# Patient Record
Sex: Male | Born: 1965 | Race: White | Hispanic: No | Marital: Married | Smoking: Never smoker
Health system: Southern US, Community
[De-identification: ages and names within clinical notes are randomized; demographics above are authoritative.]

## PROBLEM LIST (undated history)

## (undated) ENCOUNTER — Ambulatory Visit: Admission: EM | Source: Ambulatory Visit

## (undated) DIAGNOSIS — I10 Essential (primary) hypertension: Secondary | ICD-10-CM

## (undated) DIAGNOSIS — J45909 Unspecified asthma, uncomplicated: Secondary | ICD-10-CM

## (undated) DIAGNOSIS — K219 Gastro-esophageal reflux disease without esophagitis: Secondary | ICD-10-CM

## (undated) HISTORY — PX: TIBIA FRACTURE SURGERY: SHX806

---

## 2017-06-20 ENCOUNTER — Encounter (HOSPITAL_COMMUNITY): Payer: Self-pay | Admitting: *Deleted

## 2017-06-20 DIAGNOSIS — F17290 Nicotine dependence, other tobacco product, uncomplicated: Secondary | ICD-10-CM | POA: Insufficient documentation

## 2017-06-20 DIAGNOSIS — N390 Urinary tract infection, site not specified: Secondary | ICD-10-CM | POA: Diagnosis not present

## 2017-06-20 DIAGNOSIS — R300 Dysuria: Secondary | ICD-10-CM | POA: Insufficient documentation

## 2017-06-20 DIAGNOSIS — R1030 Lower abdominal pain, unspecified: Secondary | ICD-10-CM | POA: Diagnosis not present

## 2017-06-20 LAB — URINALYSIS, ROUTINE W REFLEX MICROSCOPIC
Bilirubin Urine: NEGATIVE
GLUCOSE, UA: NEGATIVE mg/dL
Ketones, ur: NEGATIVE mg/dL
NITRITE: NEGATIVE
PROTEIN: 100 mg/dL — AB
Specific Gravity, Urine: 1.017 (ref 1.005–1.030)
pH: 5 (ref 5.0–8.0)

## 2017-06-20 NOTE — ED Triage Notes (Signed)
[  pt c/o pain and burning with urination, lower abd pain that started yesterday,

## 2017-06-21 ENCOUNTER — Emergency Department (HOSPITAL_COMMUNITY)
Admission: EM | Admit: 2017-06-21 | Discharge: 2017-06-21 | Disposition: A | Discharge status: Home / Self Care | Payer: BLUE CROSS/BLUE SHIELD | Attending: Emergency Medicine | Admitting: Emergency Medicine

## 2017-06-21 DIAGNOSIS — N390 Urinary tract infection, site not specified: Secondary | ICD-10-CM

## 2017-06-21 LAB — CBC
HEMATOCRIT: 42 % (ref 39.0–52.0)
HEMOGLOBIN: 14.6 g/dL (ref 13.0–17.0)
MCH: 31.5 pg (ref 26.0–34.0)
MCHC: 34.8 g/dL (ref 30.0–36.0)
MCV: 90.7 fL (ref 78.0–100.0)
Platelets: 278 10*3/uL (ref 150–400)
RBC: 4.63 MIL/uL (ref 4.22–5.81)
RDW: 12.5 % (ref 11.5–15.5)
WBC: 12.1 10*3/uL — ABNORMAL HIGH (ref 4.0–10.5)

## 2017-06-21 LAB — BASIC METABOLIC PANEL
Anion gap: 7 (ref 5–15)
BUN: 8 mg/dL (ref 6–20)
CHLORIDE: 103 mmol/L (ref 101–111)
CO2: 27 mmol/L (ref 22–32)
Calcium: 8.9 mg/dL (ref 8.9–10.3)
Creatinine, Ser: 0.71 mg/dL (ref 0.61–1.24)
GFR calc Af Amer: >60 mL/min (ref >60)
GFR calc non Af Amer: >60 mL/min (ref >60)
Glucose, Bld: 116 mg/dL — ABNORMAL HIGH (ref 65–99)
POTASSIUM: 4.2 mmol/L (ref 3.5–5.1)
SODIUM: 137 mmol/L (ref 135–145)

## 2017-06-21 MED ORDER — TAMSULOSIN HCL 0.4 MG PO CAPS: 0.4000 mg | ORAL_CAPSULE | Freq: Every day | ORAL | 0 refills | Status: DC

## 2017-06-21 MED ORDER — CEPHALEXIN 500 MG PO CAPS: 500.0000 mg | ORAL_CAPSULE | Freq: Four times a day (QID) | ORAL | 0 refills | Status: DC

## 2017-06-21 NOTE — ED Provider Notes (Signed)
[facility] EMERGENCY DEPARTMENT Provider Note   CSN: [id number] Arrival date & time: 06/20/17  2119     History   Chief Complaint Chief Complaint  Patient presents with  . Abdominal Pain    HPI Ryan Berry is a 51 y.o. male.  The history is provided by the patient.  Abdominal Pain   This is a new problem. The current episode started 12 to 24 hours ago. The problem has not changed since onset.The pain is located in the suprapubic region. The pain is moderate. Associated symptoms include dysuria, frequency and hematuria. Pertinent negatives include fever, nausea and vomiting. The symptoms are aggravated by urination. Relieved by: rest.  pt reports for past 24 hrs he has had lower abdominal pain with dysuria and difficulty urinating He reports he is only able to pass small amounts of urine He reports hematuria He does report recent h/o urinary frequency and nocturia   PMH - none Home Medications    Prior to Admission medications   Not on File    Family History No family history on file.  Social History Social History  Substance Use Topics  . Smoking status: Never Smoker  . Smokeless tobacco: Current User  . Alcohol use No     Allergies   Penicillins   Review of Systems Review of Systems  Constitutional: Negative for fever.  Respiratory: Negative for cough.   Gastrointestinal: Positive for abdominal pain. Negative for nausea and vomiting.  Genitourinary: Positive for decreased urine volume, difficulty urinating, dysuria, frequency, hematuria and urgency. Negative for discharge and testicular pain.  All other systems reviewed and are negative.    Physical Exam Updated Vital Signs BP (!) 138/95   Pulse 87   Temp 99.5 F (37.5 C) (Oral)   Resp 17   Ht 1.803 m ( )   Wt 133.8 kg (295 lb)   SpO2 97%   BMI 41.14 kg/m   Physical Exam CONSTITUTIONAL: Well developed/well nourished HEAD: Normocephalic/atraumatic ENMT: Mucous membranes  moist NECK: supple no meningeal signs SPINE/BACK:entire spine nontender CV: S1/S2 noted, no murmurs/rubs/gallops noted LUNGS: Lungs are clear to auscultation bilaterally, no apparent distress ABDOMEN: soft, nontender, no rebound or guarding, bowel sounds noted throughout abdomen GU:no cva tenderness, no scrotal tenderness, no penile discharge, male nurse tech present for exam Rectal - no blood or melena noted.  No rectal mass.  Prostate is enlarged but nontender, male tech Marylene Land present for exam NEURO: Pt is awake/alert/appropriate, moves all extremitiesx4.  No facial droop.   EXTREMITIES: pulses normal/equal, full ROM SKIN: warm, color normal PSYCH: no abnormalities of mood noted, alert and oriented to situation   ED Treatments / Results  Labs (all labs ordered are listed, but only abnormal results are displayed) Labs Reviewed  URINALYSIS, ROUTINE W REFLEX MICROSCOPIC - Abnormal; Notable for the following:       Result Value   APPearance CLOUDY (*)    Hgb urine dipstick LARGE (*)    Protein, ur 100 (*)    Leukocytes, UA SMALL (*)    Bacteria, UA FEW (*)    Squamous Epithelial / LPF 0-5 (*)    Non Squamous Epithelial 0-5 (*)    All other components within normal limits  CBC - Abnormal; Notable for the following:    WBC 12.1 (*)    All other components within normal limits  BASIC METABOLIC PANEL - Abnormal; Notable for the following:    Glucose, Bld 116 (*)    All other components within normal limits  URINE CULTURE  GC/CHLAMYDIA PROBE AMP ([facility]) NOT AT [facility]    EKG  EKG Interpretation None       Radiology No results found.  Procedures Procedures (including critical care time)  Medications Ordered in ED Medications - No data to display   Initial Impression / Assessment and Plan / ED Course  I have reviewed the triage vital signs and the nursing notes.  Pertinent labs   results that were available during my care of the patient were reviewed by me and  considered in my medical decision making (see chart for details).     Pt stable Suspect prostatitis Will start flomax and also keflex No signs of acute urinary retention Discussed strict return precautions   Final Clinical Impressions(s) / ED Diagnoses   Final diagnoses:  Lower urinary tract infectious disease    New Prescriptions New Prescriptions   CEPHALEXIN (KEFLEX) 500 MG CAPSULE    Take 1 capsule (500 mg total) by mouth 4 (four) times daily.   TAMSULOSIN (FLOMAX) 0.4 MG CAPS CAPSULE    Take 1 capsule (0.4 mg total) by mouth daily after supper.     Zadie Rhine, MD 06/21/17 0230

## 2017-06-22 LAB — URINE CULTURE: CULTURE: NO GROWTH

## 2017-06-22 LAB — GC/CHLAMYDIA PROBE AMP (~~LOC~~) NOT AT ARMC
CHLAMYDIA, DNA PROBE: NEGATIVE
Neisseria Gonorrhea: NEGATIVE

## 2017-07-26 DIAGNOSIS — Z1389 Encounter for screening for other disorder: Secondary | ICD-10-CM | POA: Diagnosis not present

## 2017-07-26 DIAGNOSIS — N419 Inflammatory disease of prostate, unspecified: Secondary | ICD-10-CM | POA: Diagnosis not present

## 2017-07-26 DIAGNOSIS — R8279 Other abnormal findings on microbiological examination of urine: Secondary | ICD-10-CM | POA: Diagnosis not present

## 2017-07-26 DIAGNOSIS — I10 Essential (primary) hypertension: Secondary | ICD-10-CM | POA: Diagnosis not present

## 2017-07-26 DIAGNOSIS — Z0001 Encounter for general adult medical examination with abnormal findings: Secondary | ICD-10-CM | POA: Diagnosis not present

## 2017-07-26 DIAGNOSIS — R319 Hematuria, unspecified: Secondary | ICD-10-CM | POA: Diagnosis not present

## 2017-07-26 DIAGNOSIS — Z6841 Body Mass Index (BMI) 40.0 and over, adult: Secondary | ICD-10-CM | POA: Diagnosis not present

## 2017-07-27 DIAGNOSIS — Z1389 Encounter for screening for other disorder: Secondary | ICD-10-CM | POA: Diagnosis not present

## 2017-07-27 DIAGNOSIS — Z0000 Encounter for general adult medical examination without abnormal findings: Secondary | ICD-10-CM | POA: Diagnosis not present

## 2017-09-27 ENCOUNTER — Ambulatory Visit (HOSPITAL_COMMUNITY)
Admission: RE | Admit: 2017-09-27 | Discharge: 2017-09-27 | Disposition: A | Discharge status: Home / Self Care | Payer: BLUE CROSS/BLUE SHIELD | Source: Ambulatory Visit | Attending: Family Medicine | Admitting: Family Medicine

## 2017-09-27 ENCOUNTER — Other Ambulatory Visit (HOSPITAL_COMMUNITY): Payer: Self-pay | Admitting: Family Medicine

## 2017-09-27 DIAGNOSIS — N433 Hydrocele, unspecified: Secondary | ICD-10-CM | POA: Insufficient documentation

## 2017-09-27 DIAGNOSIS — R102 Pelvic and perineal pain: Secondary | ICD-10-CM | POA: Diagnosis not present

## 2017-09-27 DIAGNOSIS — N5089 Other specified disorders of the male genital organs: Secondary | ICD-10-CM | POA: Diagnosis not present

## 2017-09-27 DIAGNOSIS — Z6841 Body Mass Index (BMI) 40.0 and over, adult: Secondary | ICD-10-CM | POA: Diagnosis not present

## 2017-09-27 DIAGNOSIS — M545 Low back pain: Secondary | ICD-10-CM | POA: Diagnosis not present

## 2017-09-27 DIAGNOSIS — N452 Orchitis: Secondary | ICD-10-CM | POA: Diagnosis not present

## 2017-09-27 DIAGNOSIS — Z1389 Encounter for screening for other disorder: Secondary | ICD-10-CM | POA: Diagnosis not present

## 2017-09-29 ENCOUNTER — Ambulatory Visit (HOSPITAL_BASED_OUTPATIENT_CLINIC_OR_DEPARTMENT_OTHER): Payer: BLUE CROSS/BLUE SHIELD | Admitting: Anesthesiology

## 2017-09-29 ENCOUNTER — Encounter (HOSPITAL_BASED_OUTPATIENT_CLINIC_OR_DEPARTMENT_OTHER)
Admission: RE | Disposition: A | Discharge status: Home / Self Care | Payer: Self-pay | Source: Ambulatory Visit | Attending: Urology

## 2017-09-29 ENCOUNTER — Encounter (HOSPITAL_BASED_OUTPATIENT_CLINIC_OR_DEPARTMENT_OTHER): Payer: Self-pay | Admitting: Anesthesiology

## 2017-09-29 ENCOUNTER — Other Ambulatory Visit: Payer: Self-pay | Admitting: Urology

## 2017-09-29 ENCOUNTER — Ambulatory Visit (HOSPITAL_BASED_OUTPATIENT_CLINIC_OR_DEPARTMENT_OTHER)
Admission: RE | Admit: 2017-09-29 | Discharge: 2017-09-29 | Disposition: A | Discharge status: Home / Self Care | Payer: BLUE CROSS/BLUE SHIELD | Source: Ambulatory Visit | Attending: Urology | Admitting: Urology

## 2017-09-29 DIAGNOSIS — N453 Epididymo-orchitis: Secondary | ICD-10-CM | POA: Diagnosis not present

## 2017-09-29 DIAGNOSIS — N39490 Overflow incontinence: Secondary | ICD-10-CM | POA: Insufficient documentation

## 2017-09-29 DIAGNOSIS — Z79899 Other long term (current) drug therapy: Secondary | ICD-10-CM | POA: Diagnosis not present

## 2017-09-29 DIAGNOSIS — Z8249 Family history of ischemic heart disease and other diseases of the circulatory system: Secondary | ICD-10-CM | POA: Diagnosis not present

## 2017-09-29 DIAGNOSIS — N135 Crossing vessel and stricture of ureter without hydronephrosis: Secondary | ICD-10-CM | POA: Diagnosis not present

## 2017-09-29 DIAGNOSIS — I10 Essential (primary) hypertension: Secondary | ICD-10-CM | POA: Diagnosis not present

## 2017-09-29 DIAGNOSIS — N35011 Post-traumatic bulbous urethral stricture: Secondary | ICD-10-CM | POA: Diagnosis not present

## 2017-09-29 DIAGNOSIS — Z720 Tobacco use: Secondary | ICD-10-CM | POA: Insufficient documentation

## 2017-09-29 DIAGNOSIS — N35812 Other urethral bulbous stricture, male: Secondary | ICD-10-CM | POA: Insufficient documentation

## 2017-09-29 DIAGNOSIS — J45909 Unspecified asthma, uncomplicated: Secondary | ICD-10-CM | POA: Diagnosis not present

## 2017-09-29 DIAGNOSIS — Z8042 Family history of malignant neoplasm of prostate: Secondary | ICD-10-CM | POA: Diagnosis not present

## 2017-09-29 DIAGNOSIS — Z842 Family history of other diseases of the genitourinary system: Secondary | ICD-10-CM | POA: Diagnosis not present

## 2017-09-29 DIAGNOSIS — R339 Retention of urine, unspecified: Secondary | ICD-10-CM | POA: Insufficient documentation

## 2017-09-29 DIAGNOSIS — K219 Gastro-esophageal reflux disease without esophagitis: Secondary | ICD-10-CM | POA: Insufficient documentation

## 2017-09-29 DIAGNOSIS — E669 Obesity, unspecified: Secondary | ICD-10-CM | POA: Insufficient documentation

## 2017-09-29 DIAGNOSIS — Z6841 Body Mass Index (BMI) 40.0 and over, adult: Secondary | ICD-10-CM | POA: Diagnosis not present

## 2017-09-29 DIAGNOSIS — N3941 Urge incontinence: Secondary | ICD-10-CM | POA: Diagnosis not present

## 2017-09-29 DIAGNOSIS — N35919 Unspecified urethral stricture, male, unspecified site: Secondary | ICD-10-CM | POA: Diagnosis not present

## 2017-09-29 DIAGNOSIS — R338 Other retention of urine: Secondary | ICD-10-CM | POA: Diagnosis not present

## 2017-09-29 HISTORY — PX: CYSTOSCOPY WITH RETROGRADE URETHROGRAM: SHX6309

## 2017-09-29 HISTORY — DX: Unspecified asthma, uncomplicated: J45.909

## 2017-09-29 HISTORY — DX: Gastro-esophageal reflux disease without esophagitis: K21.9

## 2017-09-29 HISTORY — DX: Essential (primary) hypertension: I10

## 2017-09-29 LAB — POCT HEMOGLOBIN-HEMACUE: HEMOGLOBIN: 13 g/dL (ref 13.0–17.0)

## 2017-09-29 SURGERY — CYSTOSCOPY WITH RETROGRADE URETHROGRAM
Anesthesia: General | Site: Renal

## 2017-09-29 MED ORDER — SODIUM CHLORIDE 0.9% FLUSH
3.0000 mL | Freq: Two times a day (BID) | INTRAVENOUS | Status: DC
  Filled 2017-09-29: qty 3

## 2017-09-29 MED ORDER — SODIUM CHLORIDE 0.9% FLUSH
3.0000 mL | INTRAVENOUS | Status: DC | PRN
  Filled 2017-09-29: qty 3

## 2017-09-29 MED ORDER — DEXAMETHASONE SODIUM PHOSPHATE 4 MG/ML IJ SOLN
INTRAMUSCULAR | Status: DC | PRN
  Administered 2017-09-29: 10 mg via INTRAVENOUS

## 2017-09-29 MED ORDER — ONDANSETRON HCL 4 MG/2ML IJ SOLN
INTRAMUSCULAR | Status: DC | PRN
  Administered 2017-09-29: 4 mg via INTRAVENOUS

## 2017-09-29 MED ORDER — ACETAMINOPHEN 650 MG RE SUPP
650.0000 mg | RECTAL | Status: DC | PRN
  Filled 2017-09-29: qty 1

## 2017-09-29 MED ORDER — PROPOFOL 10 MG/ML IV BOLUS
INTRAVENOUS | Status: DC | PRN
  Administered 2017-09-29: 200 mg via INTRAVENOUS

## 2017-09-29 MED ORDER — FENTANYL CITRATE (PF) 100 MCG/2ML IJ SOLN
25.0000 ug | INTRAMUSCULAR | Status: DC | PRN
  Filled 2017-09-29: qty 1

## 2017-09-29 MED ORDER — FENTANYL CITRATE (PF) 100 MCG/2ML IJ SOLN
INTRAMUSCULAR | Status: AC
  Filled 2017-09-29: qty 2

## 2017-09-29 MED ORDER — ACETAMINOPHEN 325 MG PO TABS
650.0000 mg | ORAL_TABLET | ORAL | Status: DC | PRN
  Filled 2017-09-29: qty 2

## 2017-09-29 MED ORDER — MIDAZOLAM HCL 5 MG/5ML IJ SOLN
INTRAMUSCULAR | Status: DC | PRN
  Administered 2017-09-29: 2 mg via INTRAVENOUS

## 2017-09-29 MED ORDER — METOCLOPRAMIDE HCL 5 MG/ML IJ SOLN
INTRAMUSCULAR | Status: AC
  Filled 2017-09-29: qty 2

## 2017-09-29 MED ORDER — CIPROFLOXACIN IN D5W 400 MG/200ML IV SOLN
400.0000 mg | Freq: Two times a day (BID) | INTRAVENOUS | Status: DC
  Administered 2017-09-29: 400 mg via INTRAVENOUS
  Filled 2017-09-29: qty 200

## 2017-09-29 MED ORDER — SODIUM CHLORIDE 0.9 % IV SOLN
250.0000 mL | INTRAVENOUS | Status: DC | PRN
  Filled 2017-09-29: qty 250

## 2017-09-29 MED ORDER — DEXAMETHASONE SODIUM PHOSPHATE 10 MG/ML IJ SOLN
INTRAMUSCULAR | Status: AC
  Filled 2017-09-29: qty 1

## 2017-09-29 MED ORDER — FENTANYL CITRATE (PF) 100 MCG/2ML IJ SOLN
INTRAMUSCULAR | Status: DC | PRN
  Administered 2017-09-29: 25 ug via INTRAVENOUS
  Administered 2017-09-29: 50 ug via INTRAVENOUS
  Administered 2017-09-29: 25 ug via INTRAVENOUS

## 2017-09-29 MED ORDER — MORPHINE SULFATE (PF) 2 MG/ML IV SOLN
2.0000 mg | INTRAVENOUS | Status: DC | PRN
  Filled 2017-09-29: qty 1

## 2017-09-29 MED ORDER — LIDOCAINE 2% (20 MG/ML) 5 ML SYRINGE
INTRAMUSCULAR | Status: DC | PRN
  Administered 2017-09-29: 100 mg via INTRAVENOUS

## 2017-09-29 MED ORDER — ONDANSETRON HCL 4 MG/2ML IJ SOLN
INTRAMUSCULAR | Status: AC
  Filled 2017-09-29: qty 2

## 2017-09-29 MED ORDER — LACTATED RINGERS IV SOLN
INTRAVENOUS | Status: DC
  Filled 2017-09-29: qty 1000

## 2017-09-29 MED ORDER — OXYCODONE HCL 5 MG PO TABS
5.0000 mg | ORAL_TABLET | Freq: Once | ORAL | Status: DC | PRN
  Filled 2017-09-29: qty 1

## 2017-09-29 MED ORDER — MIDAZOLAM HCL 2 MG/2ML IJ SOLN
INTRAMUSCULAR | Status: AC
  Filled 2017-09-29: qty 2

## 2017-09-29 MED ORDER — MEPERIDINE HCL 25 MG/ML IJ SOLN
6.2500 mg | INTRAMUSCULAR | Status: DC | PRN
  Filled 2017-09-29: qty 1

## 2017-09-29 MED ORDER — OXYCODONE HCL 5 MG PO TABS
5.0000 mg | ORAL_TABLET | ORAL | Status: DC | PRN
  Filled 2017-09-29: qty 2

## 2017-09-29 MED ORDER — OXYCODONE HCL 5 MG/5ML PO SOLN
5.0000 mg | Freq: Once | ORAL | Status: DC | PRN
  Filled 2017-09-29: qty 5

## 2017-09-29 MED ORDER — LIDOCAINE 2% (20 MG/ML) 5 ML SYRINGE
INTRAMUSCULAR | Status: AC
  Filled 2017-09-29: qty 5

## 2017-09-29 MED ORDER — IOHEXOL 300 MG/ML  SOLN
INTRAMUSCULAR | Status: DC | PRN
  Administered 2017-09-29: 20 mL

## 2017-09-29 MED ORDER — HYDROCODONE-ACETAMINOPHEN 5-325 MG PO TABS: 1.0000 | ORAL_TABLET | Freq: Four times a day (QID) | ORAL | 0 refills | Status: DC | PRN

## 2017-09-29 MED ORDER — PROMETHAZINE HCL 25 MG/ML IJ SOLN
6.2500 mg | INTRAMUSCULAR | Status: DC | PRN
  Filled 2017-09-29: qty 1

## 2017-09-29 MED ORDER — METOCLOPRAMIDE HCL 5 MG/ML IJ SOLN
INTRAMUSCULAR | Status: DC | PRN
  Administered 2017-09-29: 10 mg via INTRAVENOUS

## 2017-09-29 MED ORDER — PROPOFOL 10 MG/ML IV BOLUS
INTRAVENOUS | Status: AC
  Filled 2017-09-29: qty 40

## 2017-09-29 MED ORDER — LACTATED RINGERS IV SOLN
INTRAVENOUS | Status: DC
  Administered 2017-09-29 (×2): via INTRAVENOUS
  Filled 2017-09-29: qty 1000

## 2017-09-29 MED ORDER — STERILE WATER FOR IRRIGATION IR SOLN
Status: DC | PRN
  Administered 2017-09-29: 3000 mL

## 2017-09-29 MED ORDER — CIPROFLOXACIN IN D5W 400 MG/200ML IV SOLN
INTRAVENOUS | Status: AC
  Filled 2017-09-29: qty 200

## 2017-09-29 SURGICAL SUPPLY — 26 items
BAG DRAIN URO-CYSTO SKYTR STRL (DRAIN) ×2 IMPLANT
BALLN NEPHROSTOMY (BALLOONS) ×4
BALLOON NEPHROSTOMY (BALLOONS) ×2 IMPLANT
CATH FOLEY 2W COUNCIL 5CC 18FR (CATHETERS) ×2 IMPLANT
CATH FOLEY 2WAY SLVR  5CC 16FR (CATHETERS)
CATH FOLEY 2WAY SLVR 5CC 16FR (CATHETERS) IMPLANT
CLOTH BEACON ORANGE TIMEOUT ST (SAFETY) ×4 IMPLANT
ELECT REM PT RETURN 9FT ADLT (ELECTROSURGICAL)
ELECTRODE REM PT RTRN 9FT ADLT (ELECTROSURGICAL) IMPLANT
GLOVE SURG SS PI 8.0 STRL IVOR (GLOVE) ×2 IMPLANT
GOWN STRL REUS W/ TWL LRG LVL3 (GOWN DISPOSABLE) ×2 IMPLANT
GOWN STRL REUS W/ TWL XL LVL3 (GOWN DISPOSABLE) ×1 IMPLANT
GOWN STRL REUS W/TWL LRG LVL3 (GOWN DISPOSABLE) ×2
GOWN STRL REUS W/TWL XL LVL3 (GOWN DISPOSABLE) ×1
GUIDEWIRE STR DUAL SENSOR (WIRE) ×2 IMPLANT
KIT RM TURNOVER CYSTO AR (KITS) ×2 IMPLANT
MANIFOLD NEPTUNE II (INSTRUMENTS) ×2 IMPLANT
NDL SAFETY ECLIPSE 18X1.5 (NEEDLE) IMPLANT
NEEDLE HYPO 18GX1.5 SHARP (NEEDLE)
NEEDLE HYPO 22GX1.5 SAFETY (NEEDLE) IMPLANT
NS IRRIG 500ML POUR BTL (IV SOLUTION) IMPLANT
PACK CYSTO (CUSTOM PROCEDURE TRAY) ×2 IMPLANT
SYR 20CC LL (SYRINGE) IMPLANT
SYR 30ML LL (SYRINGE) IMPLANT
TUBE CONNECTING 12X1/4 (SUCTIONS) IMPLANT
WATER STERILE IRR 3000ML UROMA (IV SOLUTION) ×2 IMPLANT

## 2017-09-29 NOTE — Transfer of Care (Signed)
  Last Vitals:  Vitals:   09/29/17 1023 09/29/17 1215  BP: (!) 180/96 (!) 163/98  Pulse: (!) 102 90  Resp: 20 15  Temp: 37 C 36.9 C  SpO2: 98% 98%    Last Pain:  Vitals:   09/29/17 1023  TempSrc: Oral      Patients Stated Pain Goal: 8 (09/29/17 1111)  Immediate Anesthesia Transfer of Care Note  Patient: Ryan Berry  Procedure(s) Performed: Procedure(s) (LRB): CYSTOSCOPY WITH RETROGRADE URETHROGRAM/ DILATION OF STRICTURE WITH BALLOON (N/A)  Patient Location: PACU  Anesthesia Type: General  Level of Consciousness: awake, alert  and oriented  Airway & Oxygen Therapy: Patient Spontanous Breathing and Patient connected to nasal cannula oxygen  Post-op Assessment: Report given to PACU RN and Post -op Vital signs reviewed and stable  Post vital signs: Reviewed and stable  Complications: No apparent anesthesia complications

## 2017-09-29 NOTE — Discharge Instructions (Addendum)
Indwelling Urinary Catheter Care, Adult Taking good care of your catheter will keep it working properly and help prevent problems from developing. How to wear your catheter Attach your catheter to your leg with adhesive tape or a leg strap. Make sure there is no tension on the catheter. If you use adhesive tape, first remove any sticky residue from the previous tape you used. How to wear a drainage bag  You should have received a large overnight drainage bag and a smaller leg bag that fits underneath clothing. You may wear the overnight bag at any time, but you should never wear the smaller leg bag at night.  Always keep the overnight drainage bag below the level of your bladder, but keep it off the floor. When you sleep, hang the bag inside a wastebasket that is covered by a clean plastic bag.  Always wear the leg bag below your knee. Keep the leg bag secure with a leg strap or adhesive tape. How to care for your skin  Clean the skin around the catheter at least once every day.  Shower every day. Do not take baths.  Apply creams, lotions, or ointments to your genital area only as told by your health care provider.  Do not use powders, sprays, or lotions on your genital area. How to clean your catheter and your skin 1. Wash your hands with soap and water. 2. Wet a washcloth in warm water and mild soap. 3. Use the washcloth to clean the skin where the catheter enters your body. Clean downward, wiping away from the catheter in small circles. Do not wipe toward the catheter. This can push bacteria into the urethra and cause infection. 4. Pat the area dry with a clean towel. Make sure to remove all soap. How to care for your drainage bags Empty your drainage bag when it is ?- full, or at least 2-3 times a day. Replace your drainage bag once a month or sooner if it starts to smell bad or look dirty. Do not clean your drainage bag unless told by your health care provider. Emptying a drainage  bag  Supplies Needed  Rubbing alcohol.  Gauze pad or cotton ball.  Adhesive tape or a leg strap.  Steps 1. Wash your hands with soap and water. 2. Detach the drainage bag from your leg. 3. Hold the drainage bag over the toilet or a clean container. Keep the drainage bag below your hips and bladder. This stops urine from going back into the tubing and into your bladder. 4. Open the pour spout at the bottom of the bag. 5. Empty the urine into the toilet or container. Do not let the pour spout touch any surface. This helps keep bacteria out of the bag and helps prevent infection. 6. Apply rubbing alcohol to a gauze pad or cotton ball. 7. Use the gauze pad or cotton ball to clean the pour spout. 8. Close the pour spout. 9. Attach the bag to your leg with adhesive tape or a leg strap. 10. Wash your hands.  Changing a drainage bag Supplies Needed  Alcohol wipes.  A clean drainage bag.  Adhesive tape or a leg strap.  Steps 1. Wash your hands with soap and water. 2. Detach the dirty drainage bag from your leg. 3. Pinch the rubber catheter with your fingers so that urine does not spill out. 4. Disconnect the catheter tube from the drainage tube at the connection valve. Do not let the tubes touch any surface. 5.  Clean the end of the catheter tube with an alcohol wipe. Use a different alcohol wipe to clean the end of the drainage tube. 6. Connect the catheter tube to the drainage tube of the clean drainage bag. 7. Attach the new bag to your leg with adhesive tape or a leg strap. Avoid attaching the new bag too tightly. 8. Wash your hands.  How to prevent infection and other problems  Never pull on your catheter or try to remove it. Pulling can damage your internal tissues.  Always wash your hands before and after handling your catheter.  If a leg strap gets wet, replace it with a dry one.  Drink enough fluids to keep your urine clear or pale yellow, or as told by your health  care provider.  Do not let the drainage bag or tubing touch the floor.  Wear cotton underwear. Cotton absorbs moisture and keeps your skin dry.  If you are male, wipe from front to back after each bowel movement.  Check the catheter often to make sure that it works properly and the tubing is not twisted or curled. Contact a health care provider if:  Your urine is cloudy.  Your urine smells unusually bad.  Your urine is not draining into the bag.  Your catheter gets clogged.  Your catheter starts to leak.  Your bladder feels full. Get help right away if:  You have redness, swelling, or pain where the catheter enters your body.  You have fluid, pus, or a bad smell coming from the area where the catheter enters your body.  The area where the catheter enters your body feels warm to the touch.  You have a fever.  You have pain in your abdomen, legs, lower back, or bladder.  You see blood fill the catheter.  Your urine is pink or red.  You have nausea, vomiting, or chills.  Your catheter gets pulled out.  You may remove the catheter at home on Monday morning and if you don't feel comfortable doing that, you can come to the office for the nurses to remove it.  Please call [phone] to let them know if you are coming.   Post Anesthesia Home Care Instructions  Activity: Get plenty of rest for the remainder of the day. A responsible individual must stay with you for 24 hours following the procedure.  For the next 24 hours, DO NOT: -Drive a car -Advertising copywriterperate machinery -Drink alcoholic beverages -Take any medication unless instructed by your physician -Make any legal decisions or sign important papers.  Meals: Start with liquid foods such as gelatin or soup. Progress to regular foods as tolerated. Avoid greasy, spicy, heavy foods. If nausea and/or vomiting occur, drink only clear liquids until the nausea and/or vomiting subsides. Call your physician if vomiting  continues.  Special Instructions/Symptoms: Your throat may feel dry or sore from the anesthesia or the breathing tube placed in your throat during surgery. If this causes discomfort, gargle with warm salt water. The discomfort should disappear within 24 hours.  If you had a scopolamine patch placed behind your ear for the management of post- operative nausea and/or vomiting:  1. The medication in the patch is effective for 72 hours, after which it should be removed.  Wrap patch in a tissue and discard in the trash. Wash hands thoroughly with soap and water. 2. You may remove the patch earlier than 72 hours if you experience unpleasant side effects which may include dry mouth, dizziness or visual disturbances.  3. Avoid touching the patch. Wash your hands with soap and water after contact with the patch.      This information is not intended to replace advice given to you by your health care provider. Make sure you discuss any questions you have with your health care provider. Document Released: 08/23/2005 Document Revised: 07/21/2016 Document Reviewed: 02/05/2014 Elsevier Interactive Patient Education  2018 [employer].

## 2017-09-29 NOTE — Anesthesia Procedure Notes (Signed)
Procedure Name: LMA Insertion Date/Time: 09/29/2017 11:33 AM Performed by: Shelton SilvasHollis, Kevin D, MD Pre-anesthesia Checklist: Patient identified, Emergency Drugs available, Suction available and Patient being monitored Patient Re-evaluated:Patient Re-evaluated prior to induction Oxygen Delivery Method: Circle system utilized Preoxygenation: Pre-oxygenation with 100% oxygen Induction Type: IV induction Ventilation: Mask ventilation without difficulty LMA: LMA inserted LMA Size: 5.0 Number of attempts: 1 Airway Equipment and Method: Bite block Placement Confirmation: positive ETCO2 Tube secured with: Tape Dental Injury: Teeth and Oropharynx as per pre-operative assessment

## 2017-09-29 NOTE — Anesthesia Preprocedure Evaluation (Addendum)
Anesthesia Evaluation  Patient identified by MRN, date of birth, ID band Patient awake    Reviewed: Allergy & Precautions, NPO status , Patient's Chart, lab work & pertinent test results  Airway Mallampati: I  TM Distance: >3 FB Neck ROM: Full    Dental  (+) Upper Dentures, Dental Advisory Given   Pulmonary asthma ,    breath sounds clear to auscultation       Cardiovascular hypertension, Pt. on medications  Rhythm:Regular Rate:Normal     Neuro/Psych negative neurological ROS     GI/Hepatic Neg liver ROS, GERD  ,  Endo/Other  negative endocrine ROS  Renal/GU negative Renal ROS     Musculoskeletal negative musculoskeletal ROS (+)   Abdominal (+) + obese,   Peds  Hematology negative hematology ROS (+)   Anesthesia Other Findings   Reproductive/Obstetrics                            Anesthesia Physical Anesthesia Plan  ASA: III  Anesthesia Plan: General   Post-op Pain Management:    Induction: Intravenous  PONV Risk Score and Plan: 3 and Ondansetron and Dexamethasone  Airway Management Planned: LMA  Additional Equipment: None  Intra-op Plan:   Post-operative Plan: Extubation in OR  Informed Consent: I have reviewed the patients History and Physical, chart, labs and discussed the procedure including the risks, benefits and alternatives for the proposed anesthesia with the patient or authorized representative who has indicated his/her understanding and acceptance.   Dental advisory given  Plan Discussed with: CRNA  Anesthesia Plan Comments:         Anesthesia Quick Evaluation

## 2017-09-29 NOTE — Op Note (Signed)
Procedure: 1.  Retrograde urethrogram with interpretation. 2.  Cystoscopy with balloon dilation of bulbar urethral stricture.  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  Postop diagnosis: Same with 3 cm bulbar stricture.  Surgeon: Dr. Bjorn PippinJohn Jayleon Mcfarlane.  Anesthesia: General.  Drain: 18 [place] Foley catheter.  Specimen: None.  EBL: None.  Complications: None.  Indications: Ryan Berry is a 52 year old white male who presented today with recurrent right epididymal orchitis and urge incontinence with a reduced urinary stream.  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  It was felt the dilation of the stricture was indicated urgently.  Procedure: He had been on Levaquin for the epididymoorchitis and was given Cipro preoperatively.  A general anesthetic was induced.  He was placed in the lithotomy position with a slight LPO oblique.  He was fitted with PAS hose.  His perineum and genitalia were prepped with Betadine solution and he was draped in usual sterile fashion.  A retrograde urethrogram was performed using an Asepto syringe and Omnipaque.    The retrograde urethrogram demonstrated a normal anterior urethra.  In the bulb there were 2 very narrow sections approximately a centimeter apart but the strictured areas were short.  Contrast refluxed into the bladder without evidence of prostatic abnormality.  Cystoscopy was then performed using a 23 [place] scope and 30 degree lens.  The anterior urethra was normal but there was a tight stricture in the bulb vitamin seen in the office.  An attempt was made to pass a sensor guidewire using the cystoscope but I was not comfortable that it was crossing through the second narrowed area.  I then used the single lumen semirigid ureteroscope.  The guidewire was advanced through the ureteroscope and was able to cross the strictures into the  bladder.  I then advanced the ureteroscope into the bladder to ensure the wire was appropriately placed.  Ureteroscope was removed and a 15 cm 24 French high-pressure balloon dilation catheter was passed over the wire until it was positioned across the area of stricturing.  The balloon was inflated to 18 cm of water pressure using dilute contrast and the stricture was noted to be disrupted on fluoroscopy.  The balloon was deflated and removed after a minute.  The cystoscope was then reinserted over the wire through the disrupted stricture which appeared to be approximately 3 cm in length in toto terminating just distal to the membranous urethra.  The prostatic urethra was short without significant obstruction.  Examination of the bladder revealed severe trabeculation with multiple cellules.  The ureteral orifices were unremarkable.  The mucosa had some patchy erythema consistent with recent cystitis and irritation from the wire.  The cystoscope was removed and an 7518 [place] catheter was inserted over the wire into the bladder without difficulty.  The balloon was filled with 10 mL's of fluid and the wire was removed.  The catheter was placed to leg bag drainage.  The patient was taken down from lithotomy position.  His anesthetic was reversed and he was moved to recovery room in stable condition.  There were no complications.

## 2017-09-29 NOTE — H&P (Signed)
CC/HPI: I have pain in my scrotum.     Ryan Berry is a 52 yo WM who was seen in the [facility] in mid October for the sudden onset of right testicular pain and swelling with radiation to the right groin and flank. He had gross hematuria and had TNTC WBC and RBC's but his culture was negative. he was given Keflex and Flomax. The urine cleared and the frequency and urgency improved but his stream remained weak which is chronic for him. He had recurrent symptoms 2 days ago and is now on Levaquin. He has had urgency with UUI since the visit in October. It is worse at night. He has nocturia q1hr. He has no dysuria. He feels like he empties. He has no prior history of UTIs. He had hematuria when he was younger with no pain and has had no documented stones. He has had no GU surgery or injuries. he had a negative urine in November and at his OV on 1/22. A scrotal US on 1/22 shows only a small right hydrocele.     CC: AUA Questions Scoring.  HPI: Ryan Berry is a 52 year-old male patient who is here for evaluation.      AUA Symptom Score: Less than 50% of the time he has the sensation of not emptying his bladder completely when finished urinating. More than 50% of the time he has to urinate again fewer than two hours after he has finished urinating. Less than 50% of the time he has to start and stop again several times when he urinates. 50% of the time he finds it difficult to postpone urination. Almost always he has a weak urinary stream. He never has to push or strain to begin urination. He has to get up to urinate 3 times from the time he goes to bed until the time he gets up in the morning.   Calculated AUA Symptom Score: 19    ALLERGIES: None   MEDICATIONS: Lisinopril 20 mg tablet  Ibuprofen  Levofloxacin 500 mg tablet     GU PSH: None   NON-GU PSH: Leg surgery (unspecified), Right    GU PMH: None   NON-GU PMH: Asthma GERD Hypertension    FAMILY HISTORY: Death of family member -  Father Heart Attack - Father Hematuria - Father Hypertension - Mother Prostate Cancer - Father   SOCIAL HISTORY: Marital Status: Single Preferred Language: English; Ethnicity: Not Hispanic Or Latino; Race: White Current Smoking Status: Patient does not smoke anymore. Has not smoked since 09/06/1992. Smoked for 4 years. Smoked 2 packs per day.   Tobacco Use Assessment Completed: Used Tobacco in last 30 days? Uses smokeless tobacco. Has never drank.  Drinks 1 caffeinated drink per day. Patient's occupation Scientist, water quality.    REVIEW OF SYSTEMS:    GU Review Male:   Patient reports frequent urination, hard to postpone urination, burning/ pain with urination, get up at night to urinate, leakage of urine, and stream starts and stops. Patient denies trouble starting your stream, have to strain to urinate , erection problems, and penile pain.  Gastrointestinal (Upper):   Patient denies nausea, vomiting, and indigestion/ heartburn.  Gastrointestinal (Lower):   Patient denies diarrhea and constipation.  Constitutional:   Patient denies fever, night sweats, weight loss, and fatigue.  Skin:   Patient denies itching and skin rash/ lesion.  Eyes:   Patient denies blurred vision and double vision.  Ears/ Nose/ Throat:   Patient reports sinus problems. Patient denies  sore throat.  Hematologic/Lymphatic:   Patient denies swollen glands and easy bruising.  Cardiovascular:   Patient reports leg swelling. Patient denies chest pains.  Respiratory:   Patient denies cough and shortness of breath.  Musculoskeletal:   Patient reports back pain. Patient denies joint pain.  Neurological:   Patient denies headaches and dizziness.  Psychologic:   Patient denies depression and anxiety.   VITAL SIGNS:      09/29/2017 08:23 AM  Weight 305 lb / 138.35 kg  Height 71 in / 180.34 cm  BP 180/101 mmHg  Pulse 103 /min  Temperature 99.1 F / 37.2 C  BMI 42.5 kg/m   GU PHYSICAL EXAMINATION:    Anus and  Perineum: No hemorrhoids. No anal stenosis. No rectal fissure, no anal fissure. No edema, no dimple, no perineal tenderness, no anal tenderness.  Scrotum: No lesions. No edema. No cysts. No warts.  Epididymides: Right: right head tender, right body tender, right tail tender, right head enlarged, right body enlarged, right tail enlarged. Right: No spermatocele, no masses, no cysts, no induration. Left: No spermatocele, no masses, no cysts, no tenderness, no induration, no enlargement.   Testes: Tender right testis. 3-5 cm hydrocele right testis. No tenderness, no swelling, no enlargement left testis. No swelling, no enlargement right testis. Normal location left testis. Normal location right testis. No mass, no cyst, no varicocele, no hydrocele left testis. No mass, no cyst, no varicocele right testis.   Urethral Meatus: Normal size. No lesion, no wart, no discharge, no polyp. Normal location.  Penis: Circumcised, no warts, no cracks. No dorsal Peyronie's plaques, no left corporal Peyronie's plaques, no right corporal Peyronie's plaques, no scarring, no warts. No balanitis, no meatal stenosis.  Prostate: Prostate 3 + size. Left lobe normal consistency, right lobe normal consistency. Symmetrical lobes. No prostate nodule. Left lobe no tenderness, right lobe no tenderness.   Seminal Vesicles: Nonpalpable.  Sphincter Tone: Normal sphincter. No rectal tenderness. No rectal mass.    MULTI-SYSTEM PHYSICAL EXAMINATION:    Constitutional: Obese. No physical deformities. Normally developed. Good grooming.   Neck: Neck symmetrical, not swollen. Normal tracheal position.  Respiratory: No labored breathing, no use of accessory muscles. CTA  Cardiovascular: Normal temperature, RRR without murmur. Mild ankle edema.  Lymphatic: No enlargement, no tenderness of supraclavcular, groin, neck lymph nodes.  Skin: No paleness, no jaundice, no cyanosis. No lesion, no ulcer, no rash.  Neurologic / Psychiatric: Oriented to  time, oriented to place, oriented to person. No depression, no anxiety, no agitation.  Gastrointestinal: Obese abdomen. No hernia. No mass, no tenderness, no rigidity.   Musculoskeletal: Normal gait and station of head and neck.     PAST DATA REVIEWED:  Source Of History:  Patient  Lab Test Review:   CBC with Diff, CMP  Records Review:   Previous Doctor Records, Previous Hospital Records  Urine Test Review:   Urinalysis, Urine Culture  Urodynamics Review:   Review Bladder Scan  X-Ray Review: Scrotal Ultrasound: Reviewed Films. Reviewed Report. Discussed With Patient.    Notes:                     ER and Dr. Leroy Libmaneposquale's records reviewed.    PROCEDURES:         Flexible Cystoscopy - 52000  Risks, benefits, and some of the potential complications of the procedure were discussed. 10ml of 2% lidocaine jelly was instilled intraurethrally.  Attempts at foley placement were unsuccessful.    Meatus:  Normal size.  Normal location. Normal condition.  Urethra:  Severe bulbous stricture. about 70fr opening      I was unable to get beyond the stricture to assess the prostate and bladder.   The procedure was well tolerated and there were no complications.          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 He will stay on the flomax and levaquin and I will have a foley placed today. He will return next week for cystoscopy and a voiding trial.   2   Overflow Incontinence - N39.490   3   Urinary Retention - R33.8   4   Bulbar urethral stricture - N35.011 He  has a tight bulbar stricture causing the above symptoms. I am going to take him to the OR today for urethral dilation. I have reviewed the risks of bleeding, infection, penile injury, recurrent stricturing with the need for subsequent procedures, thrombotic events and anesthetic complications.    PLAN:           Schedule Return Visit/Planned Activity: 1-2 Weeks - Office Visit, Extender, Flow Rate, PVR Ultrasound  Return Visit/Planned Activity: ASAP - Schedule Surgery

## 2017-09-30 ENCOUNTER — Encounter (HOSPITAL_BASED_OUTPATIENT_CLINIC_OR_DEPARTMENT_OTHER): Payer: Self-pay | Admitting: Urology

## 2017-09-30 NOTE — Anesthesia Postprocedure Evaluation (Signed)
Anesthesia Post Note  Patient: Ryan Berry  Procedure(s) Performed: CYSTOSCOPY WITH RETROGRADE URETHROGRAM/ DILATION OF STRICTURE WITH BALLOON (N/A Renal)     Patient location during evaluation: PACU Anesthesia Type: General Level of consciousness: awake and alert Pain management: pain level controlled Vital Signs Assessment: post-procedure vital signs reviewed and stable Respiratory status: spontaneous breathing, nonlabored ventilation, respiratory function stable and patient connected to nasal cannula oxygen Cardiovascular status: blood pressure returned to baseline and stable Postop Assessment: no apparent nausea or vomiting Anesthetic complications: no    Last Vitals:  Vitals:   09/29/17 1245 09/29/17 1328  BP: (!) 158/88 (!) 171/94  Pulse: 94 86  Resp: 20 16  Temp:  37.4 C  SpO2: 97% 99%    Last Pain:  Vitals:   09/29/17 1328  TempSrc: Oral                 Shelton SilvasKevin D Shavonta Gossen

## 2017-10-03 DIAGNOSIS — R338 Other retention of urine: Secondary | ICD-10-CM | POA: Diagnosis not present

## 2017-10-10 DIAGNOSIS — N453 Epididymo-orchitis: Secondary | ICD-10-CM | POA: Diagnosis not present

## 2017-10-10 DIAGNOSIS — N35011 Post-traumatic bulbous urethral stricture: Secondary | ICD-10-CM | POA: Diagnosis not present

## 2018-07-31 DIAGNOSIS — Z1389 Encounter for screening for other disorder: Secondary | ICD-10-CM | POA: Diagnosis not present

## 2018-07-31 DIAGNOSIS — Z0000 Encounter for general adult medical examination without abnormal findings: Secondary | ICD-10-CM | POA: Diagnosis not present

## 2018-07-31 DIAGNOSIS — Z6841 Body Mass Index (BMI) 40.0 and over, adult: Secondary | ICD-10-CM | POA: Diagnosis not present

## 2018-07-31 DIAGNOSIS — Z23 Encounter for immunization: Secondary | ICD-10-CM | POA: Diagnosis not present

## 2018-08-24 ENCOUNTER — Ambulatory Visit: Payer: BLUE CROSS/BLUE SHIELD

## 2018-10-25 IMAGING — US US SCROTUM W/ DOPPLER COMPLETE
1 series · 14 of 25 positions shown · non-contrast
Comparison: No recent prior.

ADDENDUM:
Not mentioned above is increased vascularity in the right testicle.
This is consistent with orchitis.
CLINICAL DATA: Right testicular edema.

EXAM:
SCROTAL ULTRASOUND
DOPPLER ULTRASOUND OF THE TESTICLES
TECHNIQUE: Complete ultrasound examination of the testicles, epididymis, and
other scrotal structures was performed. Color and spectral Doppler
ultrasound were also utilized to evaluate blood flow to the
testicles.

[Series 1: us scrotum w/ doppler complete · 0.09mm/px · 14 of 69 slices shown]
[im 1/69]
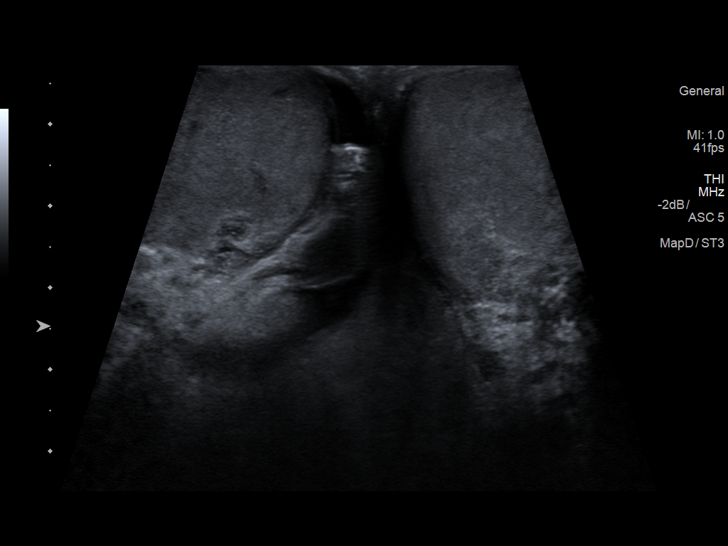
[im 6/69]
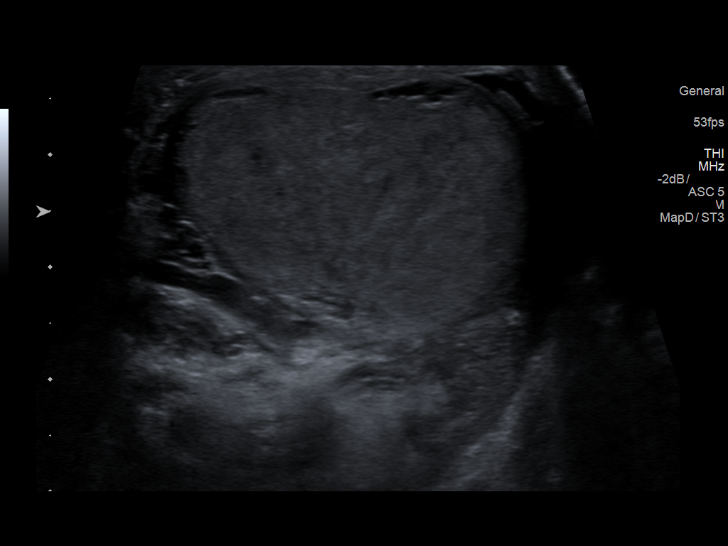
[im 12/69]
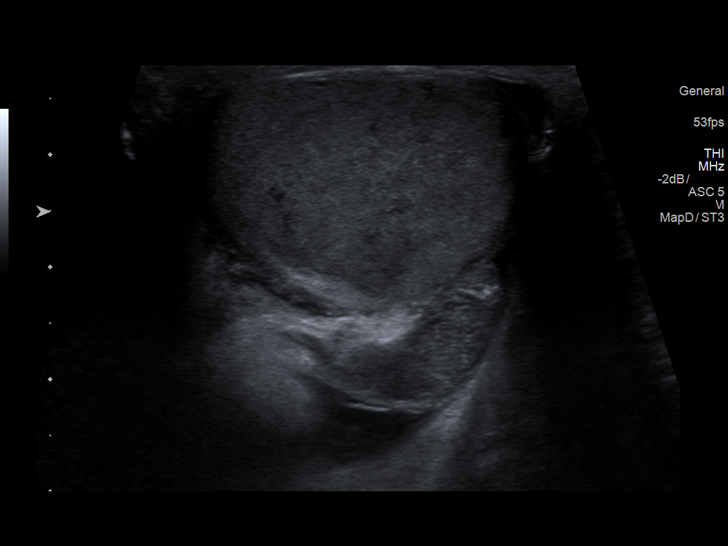
[im 18/69]
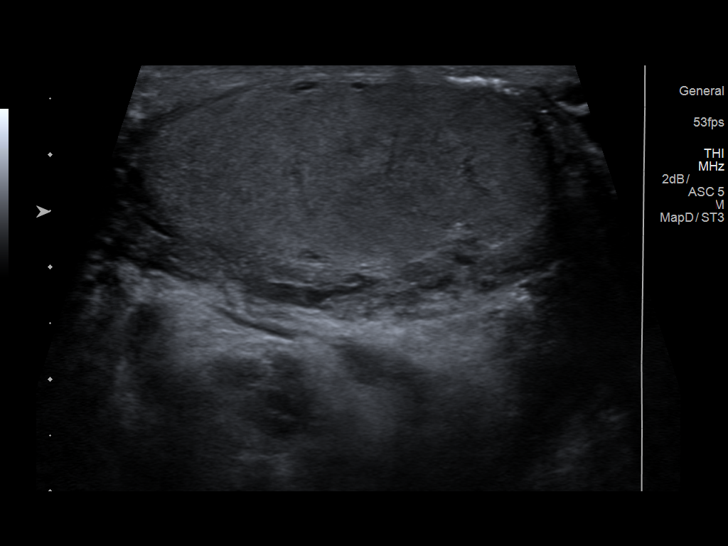
[im 23/69]
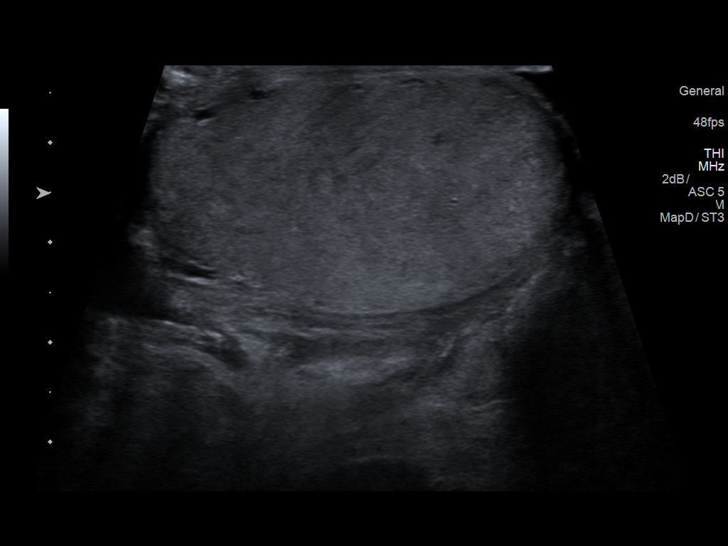
[im 26/69]
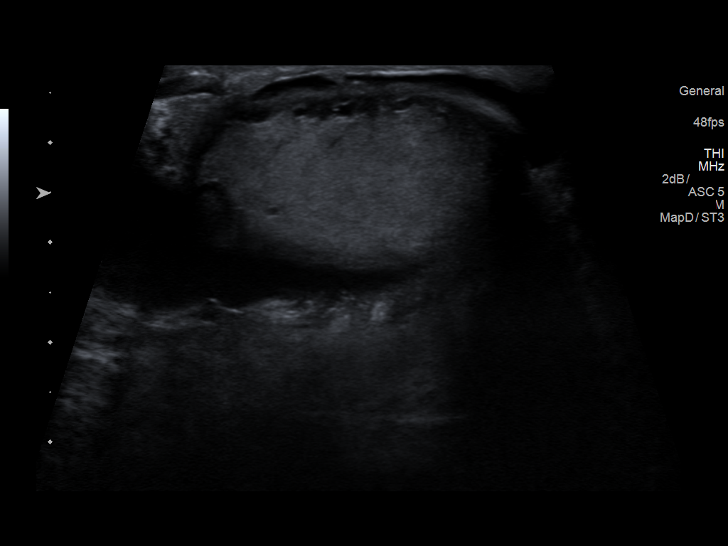
[im 32/69]
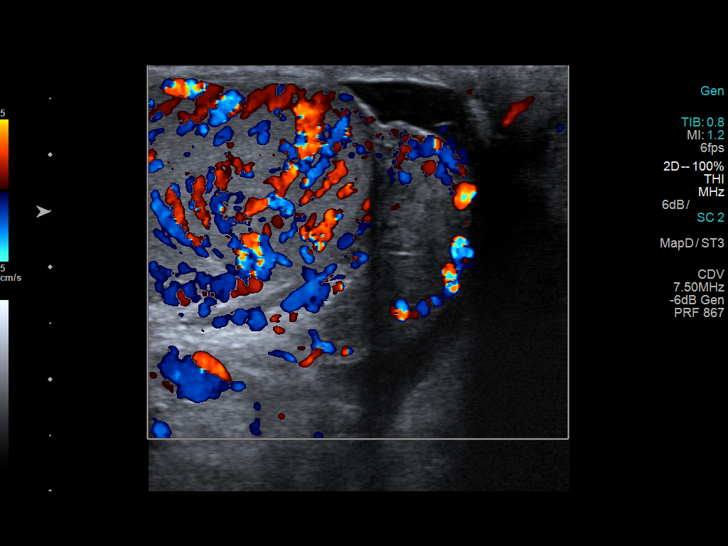
[im 37/69]
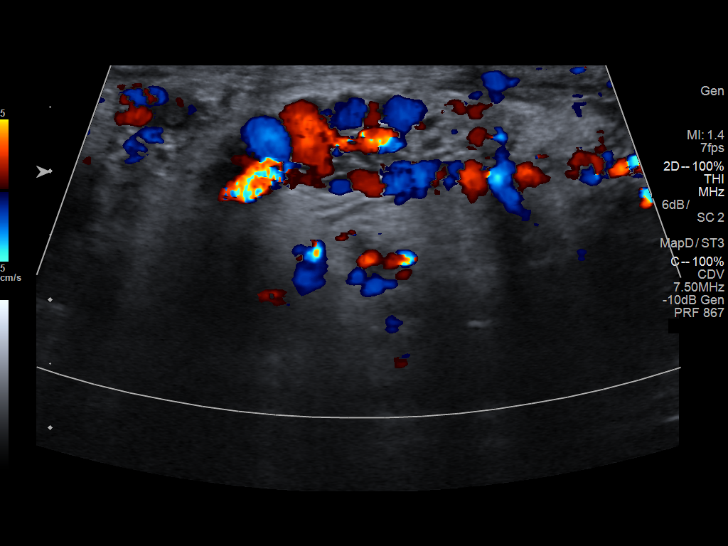
[im 43/69]
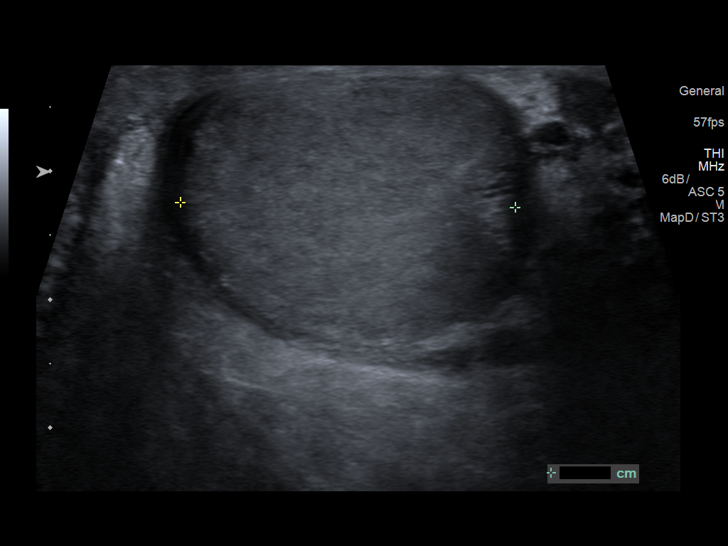
[im 46/69]
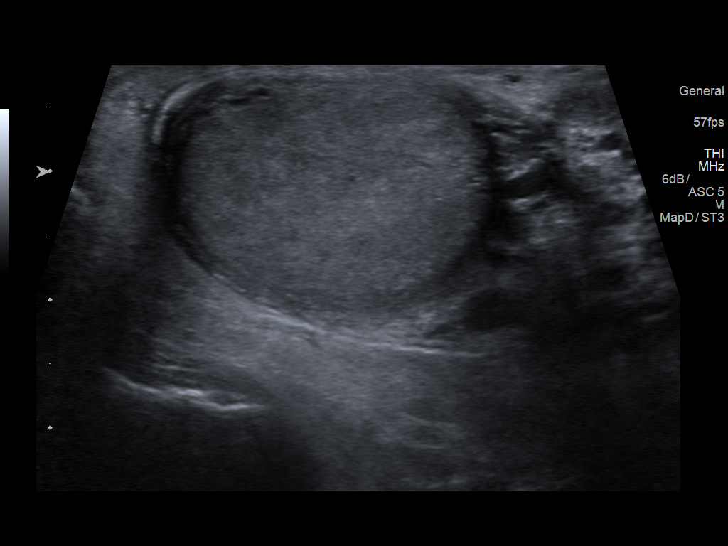
[im 52/69]
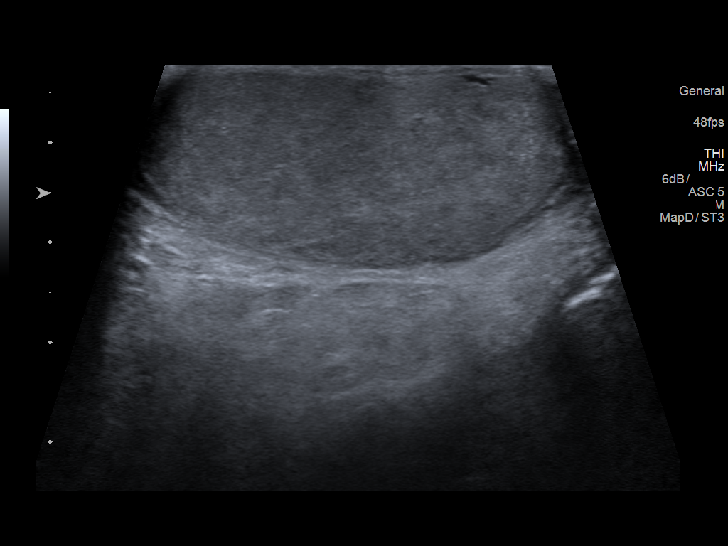
[im 57/69]
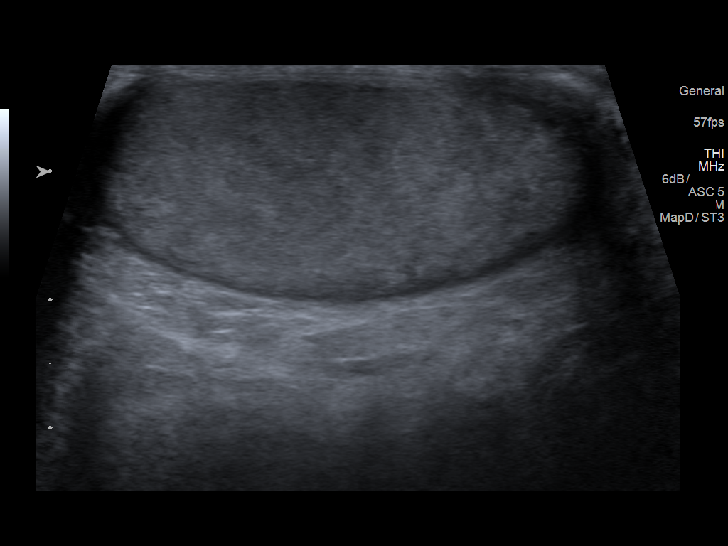
[im 63/69]
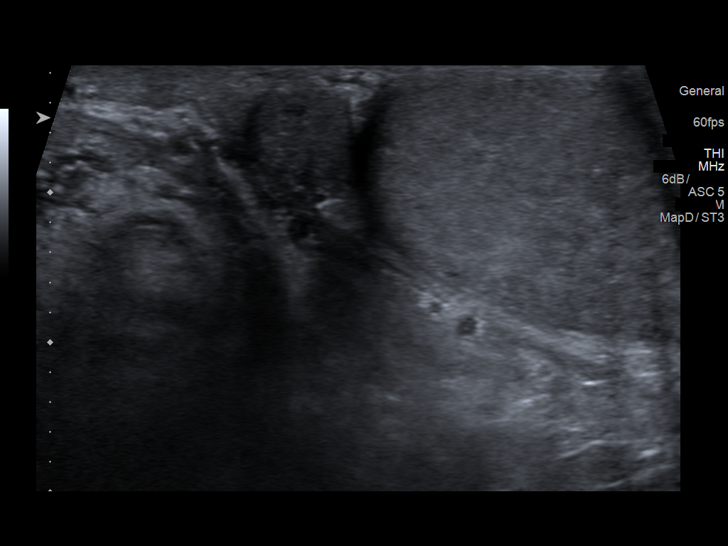
[im 69/69]
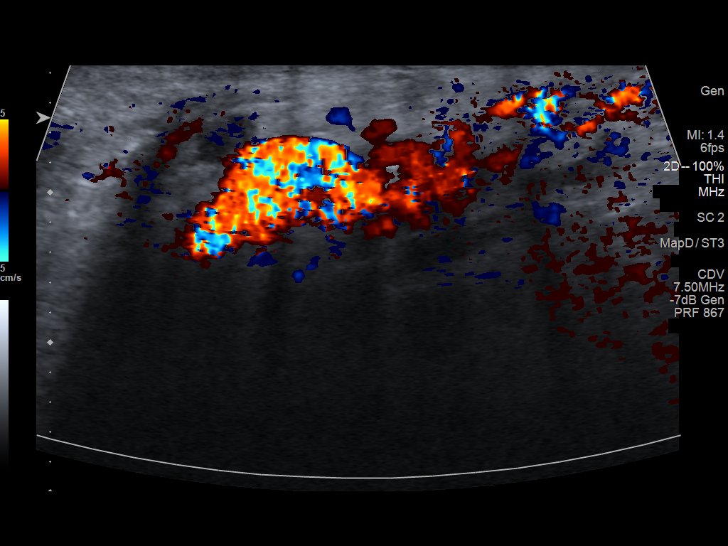

[14 of 25 positions shown; findings below may reference images not displayed]

FINDINGS: Right testicle

Measurements: 4.4 x 2.3 x 3.2 cm. No mass or microlithiasis
visualized.

Left testicle

Measurements: 4.3 x 1.9 x 2.6 cm. No mass or microlithiasis
visualized.

Right epididymis:  Normal in size and appearance.

Left epididymis:  Normal in size and appearance.

Hydrocele:  Small right hydrocele.

Varicocele:  None visualized.

Pulsed Doppler interrogation of both testes demonstrates normal low
resistance arterial and venous waveforms bilaterally.
IMPRESSION: Small right hydrocele. Exam otherwise negative. No evidence of
torsion.

## 2019-04-15 ENCOUNTER — Encounter (HOSPITAL_COMMUNITY): Payer: Self-pay

## 2019-04-15 ENCOUNTER — Emergency Department (HOSPITAL_COMMUNITY)
Admission: EM | Admit: 2019-04-15 | Discharge: 2019-04-15 | Disposition: A | Discharge status: Home / Self Care | Payer: BLUE CROSS/BLUE SHIELD | Attending: Emergency Medicine | Admitting: Emergency Medicine

## 2019-04-15 ENCOUNTER — Other Ambulatory Visit: Payer: Self-pay

## 2019-04-15 DIAGNOSIS — Z79899 Other long term (current) drug therapy: Secondary | ICD-10-CM | POA: Insufficient documentation

## 2019-04-15 DIAGNOSIS — Y939 Activity, unspecified: Secondary | ICD-10-CM | POA: Insufficient documentation

## 2019-04-15 DIAGNOSIS — Y999 Unspecified external cause status: Secondary | ICD-10-CM | POA: Diagnosis not present

## 2019-04-15 DIAGNOSIS — T161XXA Foreign body in right ear, initial encounter: Secondary | ICD-10-CM | POA: Insufficient documentation

## 2019-04-15 DIAGNOSIS — X58XXXA Exposure to other specified factors, initial encounter: Secondary | ICD-10-CM | POA: Insufficient documentation

## 2019-04-15 DIAGNOSIS — Y929 Unspecified place or not applicable: Secondary | ICD-10-CM | POA: Diagnosis not present

## 2019-04-15 DIAGNOSIS — I10 Essential (primary) hypertension: Secondary | ICD-10-CM | POA: Diagnosis not present

## 2019-04-15 DIAGNOSIS — J45909 Unspecified asthma, uncomplicated: Secondary | ICD-10-CM | POA: Insufficient documentation

## 2019-04-15 MED ORDER — NEOMYCIN-POLYMYXIN-HC 1 % OT SOLN
3.0000 [drp] | Freq: Once | OTIC | Status: AC
  Administered 2019-04-15: 3 [drp] via OTIC
  Filled 2019-04-15: qty 10

## 2019-04-15 MED ORDER — LIDOCAINE HCL (PF) 1 % IJ SOLN
INTRAMUSCULAR | Status: AC
  Filled 2019-04-15: qty 2

## 2019-04-15 MED ORDER — LIDOCAINE HCL (PF) 1 % IJ SOLN
INTRAMUSCULAR | Status: AC
  Filled 2019-04-15: qty 5

## 2019-04-15 MED ORDER — LIDOCAINE HCL (PF) 1 % IJ SOLN
10.0000 mL | Freq: Once | INTRAMUSCULAR | Status: AC
  Administered 2019-04-15: 10 mL

## 2019-04-15 NOTE — Discharge Instructions (Addendum)
Apply 3 drops of the antibiotic given to your right ear 3 times a day for the next 7 days.  This will help prevent any infection to the abrasion in your ear canal.

## 2019-04-15 NOTE — ED Notes (Signed)
Awaiting eval  

## 2019-04-15 NOTE — ED Notes (Signed)
ED Provider at bedside. 

## 2019-04-15 NOTE — ED Notes (Signed)
Fluttering object visible to R ear   Pt rubbing ear intermittently  Light to ear after room darkened - bug did not back out  Saline to try to flush bug without success   Dr Lacinda Axon informed with orders recieved

## 2019-04-15 NOTE — ED Provider Notes (Signed)
[facility] EMERGENCY DEPARTMENT Provider Note   CSN: [id number] Arrival date & time: 04/15/19  2114    History   Chief Complaint Chief Complaint  Patient presents with  . Bug in Ear    right    HPI Ryan Berry is a 53 y.o. male presenting for assistance with a bug which flew into his right ear prior to arrival.  His wife attempted to flush the ear with water with no improvement.  He reports feeling an intermittent fluttering sensation inside the ear canal.  There is been no biting or stinging or pain.  He denies decreased hearing acuity, no dizziness or other concerns.     The history is provided by the patient.    Past Medical History:  Diagnosis Date  . Asthma    as a child  . GERD (gastroesophageal reflux disease)    at times  . Hypertension     There are no active problems to display for this patient.   Past Surgical History:  Procedure Laterality Date  . CYSTOSCOPY WITH RETROGRADE URETHROGRAM N/A 09/29/2017   Procedure: CYSTOSCOPY WITH RETROGRADE URETHROGRAM/ DILATION OF STRICTURE WITH BALLOON;  Surgeon: Irine Seal, MD;  Location: [facility];  Service: Urology;  Laterality: N/A;  . TIBIA FRACTURE SURGERY Right 2004-2005   broken leg         Home Medications    Prior to Admission medications   Medication Sig Start Date End Date Taking? Authorizing Provider  HYDROcodone-acetaminophen (NORCO) 5-325 MG tablet Take 1 tablet by mouth every 6 (six) hours as needed for moderate pain. 09/29/17   Irine Seal, MD  levofloxacin (LEVAQUIN) 500 MG tablet Take 500 mg by mouth daily.    [provider]  lisinopril (PRINIVIL,ZESTRIL) 20 MG tablet Take 20 mg by mouth daily.    [provider]    Family History History reviewed. No pertinent family history.  Social History Social History   Tobacco Use  . Smoking status: Never Smoker  . Smokeless tobacco: Current User  Substance Use Topics  . Alcohol use: No  . Drug use: No      Allergies   Penicillins   Review of Systems Review of Systems  Constitutional: Negative for chills and fever.  HENT: Negative for congestion, ear pain, rhinorrhea, sinus pressure, sore throat, trouble swallowing and voice change.   Eyes: Negative for discharge.  Respiratory: Negative for cough, shortness of breath, wheezing and stridor.   Cardiovascular: Negative for chest pain.  Gastrointestinal: Negative for abdominal pain.  Genitourinary: Negative.      Physical Exam Updated Vital Signs BP (!) 165/97 (BP Location: Right Arm)   Pulse 98   Temp 98.2 F (36.8 C)   Resp 20   Ht 5\' 11"  (1.803 m)   Wt 136.1 kg   SpO2 100%   BMI 41.84 kg/m   Physical Exam Vitals signs reviewed.  Constitutional:      Appearance: Normal appearance.  HENT:     Head: Normocephalic and atraumatic.     Right Ear: A foreign body is present.     Ears:     Comments: Dark foreign object deep within the right ear canal. Eyes:     Conjunctiva/sclera: Conjunctivae normal.  Cardiovascular:     Rate and Rhythm: Normal rate.  Pulmonary:     Effort: Pulmonary effort is normal.  Skin:    General: Skin is warm and dry.  Neurological:     Mental Status: He is alert.  ED Treatments / Results  Labs (all labs ordered are listed, but only abnormal results are displayed) Labs Reviewed - No data to display  EKG None  Radiology No results found.  Procedures .Foreign Body Removal  Date/Time: 04/15/2019 11:00 PM Performed by: Burgess AmorIdol, Davinder Haff, PA-C Authorized by: Burgess AmorIdol, Kellyn Mansfield, PA-C  Consent: Verbal consent obtained. Risks and benefits: risks, benefits and alternatives were discussed Consent given by: patient Patient understanding: patient states understanding of the procedure being performed Body area: ear Location details: right ear Localization method: ENT speculum Removal mechanism: alligator forceps and irrigation Complexity: simple 1 objects recovered. Objects recovered: moth  Post-procedure assessment: foreign body removed Patient tolerance: patient tolerated the procedure well with no immediate complications Comments: Small abrasion along the anterior ear canal.  TM intact.   (including critical care time)  Medications Ordered in ED Medications  lidocaine (PF) (XYLOCAINE) 1 % injection (5 mLs  Handoff 04/15/19 2157)  lidocaine (PF) (XYLOCAINE) 1 % injection 10 mL (10 mLs Other Given 04/15/19 2150)  NEOMYCIN-POLYMYXIN-HYDROCORTISONE (CORTISPORIN) OTIC (EAR) solution 3 drop (3 drops Right EAR Given 04/15/19 2317)     Initial Impression / Assessment and Plan / ED Course  I have reviewed the triage vital signs and the nursing notes.  Pertinent labs & imaging results that were available during my care of the patient were reviewed by me and considered in my medical decision making (see chart for details).        Patient with a moth foreign body in his right ear which has been removed.  There is a small abrasion on the anterior ear canal which I suspect occurred from the insect itself as the alligator forceps would not be able to cause such an injury.  He was given Cortisporin to apply 3 times daily for the next 7 days to protect from infection.  PRN follow-up anticipated.  Final Clinical Impressions(s) / ED Diagnoses   Final diagnoses:  Ear foreign body, right, initial encounter    ED Discharge Orders    None       Victoriano Laindol, Destin Kittler, PA-C 04/16/19 0019    Donnetta Hutchingook, Brian, MD 04/17/19 1620

## 2019-04-15 NOTE — ED Notes (Signed)
Family member getting upset that pt has not been evaluated

## 2019-04-15 NOTE — ED Triage Notes (Signed)
Pt was outside around 2115 when a bug flew in his ear. hasn't came out yet, unable to get it out. Feels it flying around.

## 2019-08-06 DIAGNOSIS — Z6841 Body Mass Index (BMI) 40.0 and over, adult: Secondary | ICD-10-CM | POA: Diagnosis not present

## 2019-08-06 DIAGNOSIS — Z1389 Encounter for screening for other disorder: Secondary | ICD-10-CM | POA: Diagnosis not present

## 2019-08-06 DIAGNOSIS — Z0000 Encounter for general adult medical examination without abnormal findings: Secondary | ICD-10-CM | POA: Diagnosis not present

## 2019-08-31 ENCOUNTER — Other Ambulatory Visit: Payer: Self-pay

## 2019-08-31 ENCOUNTER — Encounter (HOSPITAL_COMMUNITY): Payer: Self-pay | Admitting: Emergency Medicine

## 2019-08-31 ENCOUNTER — Emergency Department (HOSPITAL_COMMUNITY)
Admission: EM | Admit: 2019-08-31 | Discharge: 2019-08-31 | Disposition: A | Discharge status: Home / Self Care | Payer: BC Managed Care – PPO | Attending: Emergency Medicine | Admitting: Emergency Medicine

## 2019-08-31 DIAGNOSIS — W260XXA Contact with knife, initial encounter: Secondary | ICD-10-CM | POA: Diagnosis not present

## 2019-08-31 DIAGNOSIS — I10 Essential (primary) hypertension: Secondary | ICD-10-CM | POA: Diagnosis not present

## 2019-08-31 DIAGNOSIS — Z79899 Other long term (current) drug therapy: Secondary | ICD-10-CM | POA: Insufficient documentation

## 2019-08-31 DIAGNOSIS — Y999 Unspecified external cause status: Secondary | ICD-10-CM | POA: Diagnosis not present

## 2019-08-31 DIAGNOSIS — Y9389 Activity, other specified: Secondary | ICD-10-CM | POA: Diagnosis not present

## 2019-08-31 DIAGNOSIS — Y929 Unspecified place or not applicable: Secondary | ICD-10-CM | POA: Diagnosis not present

## 2019-08-31 DIAGNOSIS — S61412A Laceration without foreign body of left hand, initial encounter: Secondary | ICD-10-CM | POA: Diagnosis not present

## 2019-08-31 MED ORDER — LIDOCAINE HCL (PF) 1 % IJ SOLN
INTRAMUSCULAR | Status: AC
  Filled 2019-08-31: qty 5

## 2019-08-31 MED ORDER — LIDOCAINE-EPINEPHRINE (PF) 2 %-1:200000 IJ SOLN: 20.0000 mL | Freq: Once | INTRAMUSCULAR | Status: DC

## 2019-08-31 NOTE — ED Triage Notes (Signed)
Patient with laceration to his L hand. Was opening a present and hit his hand with a knife. Laceration in the palmar surface of the L hand. Bleeding controlled.

## 2019-08-31 NOTE — ED Provider Notes (Signed)
[facility] EMERGENCY DEPARTMENT Provider Note   CSN: [id number] Arrival date & time: 08/31/19  6568     History Chief Complaint  Patient presents with  . Laceration    Ryan Berry is a 53 y.o. male who presents the emergency department chief complaint of left hand laceration.  Patient states he was opening his grandkids Christmas presents with his pocket knife when it slipped and he stabbed the thenar eminence of his left hand with the knife.  This occurred around 5:30 PM.  He states he has had his tetanus vaccination within the last 1 to 2 years.  He denies any numbness tingling or loss of strength or range of motion of the left thumb and hand.  He is right-hand dominant.  HPI     Past Medical History:  Diagnosis Date  . Asthma    as a child  . GERD (gastroesophageal reflux disease)    at times  . Hypertension     There are no problems to display for this patient.   Past Surgical History:  Procedure Laterality Date  . CYSTOSCOPY WITH RETROGRADE URETHROGRAM N/A 09/29/2017   Procedure: CYSTOSCOPY WITH RETROGRADE URETHROGRAM/ DILATION OF STRICTURE WITH BALLOON;  Surgeon: Irine Seal, MD;  Location: [facility];  Service: Urology;  Laterality: N/A;  . TIBIA FRACTURE SURGERY Right 2004-2005   broken leg        Family History  Problem Relation Age of Onset  . Heart attack Father     Social History   Tobacco Use  . Smoking status: Never Smoker  . Smokeless tobacco: Current User    Types: Snuff  Substance Use Topics  . Alcohol use: No  . Drug use: No    Home Medications Prior to Admission medications   Medication Sig Start Date End Date Taking? Authorizing Provider  HYDROcodone-acetaminophen (NORCO) 5-325 MG tablet Take 1 tablet by mouth every 6 (six) hours as needed for moderate pain. 09/29/17   Irine Seal, MD  levofloxacin (LEVAQUIN) 500 MG tablet Take 500 mg by mouth daily.    [provider]  lisinopril (PRINIVIL,ZESTRIL) 20 MG  tablet Take 20 mg by mouth daily.    [provider]    Allergies    Penicillins  Review of Systems   Review of Systems Ten systems reviewed and are negative for acute change, except as noted in the HPI.   Physical Exam Updated Vital Signs BP (!) 146/99 (BP Location: Right Arm)   Pulse 75   Temp 98 F (36.7 C) (Oral)   Resp 18   Ht 5\' 11"  (1.803 m)   Wt 136.1 kg   SpO2 98%   BMI 41.84 kg/m   Physical Exam Vitals and nursing note reviewed.  Constitutional:      General: He is not in acute distress.    Appearance: He is well-developed. He is not diaphoretic.  HENT:     Head: Normocephalic and atraumatic.  Eyes:     General: No scleral icterus.    Conjunctiva/sclera: Conjunctivae normal.  Cardiovascular:     Rate and Rhythm: Normal rate and regular rhythm.     Heart sounds: Normal heart sounds.  Pulmonary:     Effort: Pulmonary effort is normal. No respiratory distress.     Breath sounds: Normal breath sounds.  Abdominal:     Palpations: Abdomen is soft.     Tenderness: There is no abdominal tenderness.  Musculoskeletal:     Cervical back: Normal range of motion and  neck supple.     Comments: 3 cm laceration of the left thenar eminence. RUE exam Inspection- No erythema, swelling, atrophy, hypertrophy, abrasions. Palpation- No TTP, compartments soft ROM- Full ROM about the shoulder, elbow, wrist. Strength- 5/5 AIN/PIN/U NV- SILT M/R/U, +2 Radial +2 ulnar pulse   Skin:    General: Skin is warm and dry.  Neurological:     Mental Status: He is alert.  Psychiatric:        Behavior: Behavior normal.     ED Results / Procedures / Treatments   Labs (all labs ordered are listed, but only abnormal results are displayed) Labs Reviewed - No data to display  EKG None  Radiology No results found.  Procedures .Marland KitchenLaceration Repair  Date/Time: 08/31/2019 11:03 PM Performed by: Arthor Captain, PA-C Authorized by: Arthor Captain, PA-C   Consent:     Consent obtained:  Verbal   Consent given by:  Patient   Risks discussed:  Infection, pain, retained foreign body and need for additional repair   Alternatives discussed:  No treatment Anesthesia (see MAR for exact dosages):    Anesthesia method:  Local infiltration   Local anesthetic:  Lidocaine 1% w/o epi Laceration details:    Location:  Hand   Hand location:  L palm   Length (cm):  3   Depth (mm):  15 Repair type:    Repair type:  Simple Exploration:    Wound exploration: wound explored through full range of motion and entire depth of wound probed and visualized   Treatment:    Area cleansed with:  Betadine   Amount of cleaning:  Standard   Irrigation solution:  Sterile saline Skin repair:    Repair method:  Sutures   Suture size:  4-0   Suture material:  Prolene   Suture technique:  Simple interrupted and horizontal mattress   Number of sutures:  3 Approximation:    Approximation:  Close Post-procedure details:    Dressing:  Non-adherent dressing   Patient tolerance of procedure:  Tolerated well, no immediate complications   (including critical care time)  Medications Ordered in ED Medications - No data to display  ED Course  I have reviewed the triage vital signs and the nursing notes.  Pertinent labs & imaging results that were available during my care of the patient were reviewed by me and considered in my medical decision making (see chart for details).    MDM Rules/Calculators/A&P                      Ryan Berry is a 53 y.o. male who presents to ED for laceration of left hand. Wound thoroughly cleaned in ED today. Wound explored and bottom of wound seen in a bloodless field. Laceration repaired as dictated above. Patient counseled on home wound care. Follow up with PCP/urgent care or return to ER for suture removal in 7 days. Patient was urged to return to the Emergency Department for worsening pain, swelling, expanding erythema especially if it streaks  away from the affected area, fever, or for any additional concerns. Patient verbalized understanding. All questions answered.  Final Clinical Impression(s) / ED Diagnoses Final diagnoses:  Laceration of left palm, initial encounter    Rx / DC Orders ED Discharge Orders    None       Arthor Captain, PA-C 08/31/19 2306    Bethann Berkshire, MD 09/01/19 [phone]

## 2019-08-31 NOTE — Discharge Instructions (Addendum)

## 2019-09-06 ENCOUNTER — Ambulatory Visit: Payer: BC Managed Care – PPO | Attending: Internal Medicine

## 2019-09-06 ENCOUNTER — Other Ambulatory Visit: Payer: Self-pay

## 2019-09-06 DIAGNOSIS — Z20828 Contact with and (suspected) exposure to other viral communicable diseases: Secondary | ICD-10-CM | POA: Diagnosis not present

## 2019-09-06 DIAGNOSIS — Z20822 Contact with and (suspected) exposure to covid-19: Secondary | ICD-10-CM

## 2019-09-08 LAB — NOVEL CORONAVIRUS, NAA: SARS-CoV-2, NAA: DETECTED — AB

## 2019-09-17 DIAGNOSIS — T148XXD Other injury of unspecified body region, subsequent encounter: Secondary | ICD-10-CM | POA: Diagnosis not present

## 2019-09-17 DIAGNOSIS — Z1389 Encounter for screening for other disorder: Secondary | ICD-10-CM | POA: Diagnosis not present

## 2019-09-17 DIAGNOSIS — Z6841 Body Mass Index (BMI) 40.0 and over, adult: Secondary | ICD-10-CM | POA: Diagnosis not present

## 2019-09-17 DIAGNOSIS — Z4802 Encounter for removal of sutures: Secondary | ICD-10-CM | POA: Diagnosis not present

## 2020-01-25 ENCOUNTER — Encounter: Payer: Self-pay | Admitting: Urology

## 2020-01-25 ENCOUNTER — Ambulatory Visit: Payer: BC Managed Care – PPO | Admitting: Urology

## 2020-01-25 ENCOUNTER — Other Ambulatory Visit: Payer: Self-pay

## 2020-01-25 VITALS — BP 147/94 | HR 111 | Temp 97.1°F | Ht 71.0 in | Wt 295.0 lb

## 2020-01-25 DIAGNOSIS — R300 Dysuria: Secondary | ICD-10-CM | POA: Diagnosis not present

## 2020-01-25 DIAGNOSIS — N453 Epididymo-orchitis: Secondary | ICD-10-CM | POA: Diagnosis not present

## 2020-01-25 DIAGNOSIS — N35912 Unspecified bulbous urethral stricture, male: Secondary | ICD-10-CM | POA: Diagnosis not present

## 2020-01-25 LAB — POCT URINALYSIS DIPSTICK
Bilirubin, UA: NEGATIVE
Glucose, UA: NEGATIVE
Ketones, UA: NEGATIVE
Nitrite, UA: NEGATIVE
Protein, UA: POSITIVE — AB
Spec Grav, UA: 1.01 (ref 1.010–1.025)
Urobilinogen, UA: 0.2 E.U./dL
pH, UA: 6 (ref 5.0–8.0)

## 2020-01-25 LAB — BLADDER SCAN AMB NON-IMAGING: Scan Result: 20.1

## 2020-01-25 MED ORDER — SULFAMETHOXAZOLE-TRIMETHOPRIM 800-160 MG PO TABS: 1.0000 | ORAL_TABLET | Freq: Two times a day (BID) | ORAL | 0 refills | Status: DC

## 2020-01-25 MED ORDER — CIPROFLOXACIN HCL 500 MG PO TABS
500.0000 mg | ORAL_TABLET | Freq: Once | ORAL | Status: AC
  Administered 2020-01-25: 500 mg via ORAL

## 2020-01-25 NOTE — Progress Notes (Signed)
Urological Symptom Review  Patient is experiencing the following symptoms: Frequent urination Burning/pain with urination Get up at night to urinate Weak stream   Review of Systems  Gastrointestinal (upper)  : Negative for upper GI symptoms  Gastrointestinal (lower) : Negative for lower GI symptoms  Constitutional : Negative for symptoms  Skin: Negative for skin symptoms  Eyes: Negative for eye symptoms  Ear/Nose/Throat : Negative for Ear/Nose/Throat symptoms  Hematologic/Lymphatic: Negative for Hematologic/Lymphatic symptoms  Cardiovascular : Negative for cardiovascular symptoms  Respiratory : Negative for respiratory symptoms  Endocrine: Negative for endocrine symptoms  Musculoskeletal: Negative for musculoskeletal symptoms  Neurological: Negative for neurological symptoms  Psychologic: Negative for psychiatric symptoms  Uroflow  Peak Flow: 73ml Average Flow: 47ml Voided Volume: Voiding Time: 48sec Flow Time: 48sec Time to Peak Flow: 11sec  PVR Volume: 20.19ml

## 2020-01-25 NOTE — Progress Notes (Signed)
01/25/2020 9:11 AM   Ryan Berry 24-Feb-1966 [id number]  Referring provider: No referring provider defined for this encounter.  Weak stream and hx of urethral stricture  HPI: Ryan Berry is a 54yo here for followup for urethral stricture disease. He was doing well for the past 2 years until 3 months ago when the stream became weaker, he has intermittent suprapubic pain related to urination. He has intermittent dysuria. No recent UTI. He has intermittent left testicular pain for the past 2 months. NO fevers  His records from [facility] are as follows:  09/29/17: Ryan Berry is a 54 yo WM who was seen in the [facility] in mid October for the sudden onset of right testicular pain and swelling with radiation to the right groin and flank. He had gross hematuria and had TNTC WBC and RBC's but his culture was negative. he was given Keflex and Flomax. The urine cleared and the frequency and urgency improved but his stream remained weak which is chronic for him. He had recurrent symptoms 2 days ago and is now on Levaquin. He has had urgency with UUI since the visit in October. It is worse at night. He has nocturia q1hr. He has no dysuria. He feels like he empties. He has no prior history of UTIs. He had hematuria when he was younger with no pain and has had no documented stones. He has had no GU surgery or injuries. he had a negative urine in November and at his OV on 1/22. A scrotal US on 1/22 shows only a small right hydrocele.    10/10/17: He underwent urethral dilation same day as last OV with Dr Jeffie Pollock. He also continued treatment for right epididymoorchitis. He's noted huge improvement in urine stream, frequency/urgency since the procedure. He endorses a strong, steady stream. Denies dysuria, gross hematuria. His swelling and tenderness in the right scrotum has improved as well. He denies fevers.     PMH: Past Medical History:  Diagnosis Date  . Asthma    as a child  . GERD (gastroesophageal reflux  disease)    at times  . Hypertension     Surgical History: Past Surgical History:  Procedure Laterality Date  . CYSTOSCOPY WITH RETROGRADE URETHROGRAM N/A 09/29/2017   Procedure: CYSTOSCOPY WITH RETROGRADE URETHROGRAM/ DILATION OF STRICTURE WITH BALLOON;  Surgeon: Irine Seal, MD;  [facility];  Service: Urology;  Laterality: N/A;  . TIBIA FRACTURE SURGERY Right 2004-2005   broken leg     Home Medications:  Allergies as of 01/25/2020      Reactions   Penicillins       Medication List       Accurate as of Jan 25, 2020  9:11 AM. If you have any questions, ask your nurse or doctor.        STOP taking these medications   levofloxacin 500 MG tablet Commonly known as: LEVAQUIN Stopped by: Nicolette Bang, MD     TAKE these medications   HYDROcodone-acetaminophen 5-325 MG tablet Commonly known as: Norco Take 1 tablet by mouth every 6 (six) hours as needed for moderate pain.   lisinopril 20 MG tablet Commonly known as: ZESTRIL Take 20 mg by mouth daily.       Allergies:  Allergies  Allergen Reactions  . Penicillins     Family History: Family History  Problem Relation Age of Onset  . Heart attack Father     Social History:  reports that he has never smoked. His smokeless tobacco use  includes snuff. He reports that he does not drink alcohol or use drugs.  ROS: All other review of systems were reviewed and are negative except what is noted above in HPI  Physical Exam: BP (!) 147/94   Pulse (!) 111   Temp (!) 97.1 F (36.2 C)   Ht 5\' 11"  (1.803 m)   Wt 295 lb (133.8 kg)   BMI 41.14 kg/m   Constitutional:  Alert and oriented, No acute distress. HEENT: Brown AT, moist mucus membranes.  Trachea midline, no masses. Cardiovascular: No clubbing, cyanosis, or edema. Respiratory: Normal respiratory effort, no increased work of breathing. GI: Abdomen is soft, nontender, nondistended, no abdominal masses GU: No CVA tenderness. Circumcised  phallus. No masses/lesions on penis, testis, scrotum. Left epididymal tail firm and tender Lymph: No cervical or inguinal lymphadenopathy. Skin: No rashes, bruises or suspicious lesions. Neurologic: Grossly intact, no focal deficits, moving all 4 extremities. Psychiatric: Normal mood and affect.  Laboratory Data: Lab Results  Component Value Date   WBC 12.1 (H) 06/21/2017   HGB 13.0 09/29/2017   HCT 42.0 06/21/2017   MCV 90.7 06/21/2017   PLT 278 06/21/2017    Lab Results  Component Value Date   CREATININE 0.71 06/21/2017    No results found for: PSA  No results found for: TESTOSTERONE  No results found for: HGBA1C  Urinalysis    Component Value Date/Time   COLORURINE YELLOW 06/20/2017 2136   APPEARANCEUR CLOUDY (A) 06/20/2017 2136   LABSPEC 1.017 06/20/2017 2136   PHURINE 5.0 06/20/2017 2136   GLUCOSEU NEGATIVE 06/20/2017 2136   HGBUR LARGE (A) 06/20/2017 2136   BILIRUBINUR neg 01/25/2020 0900   KETONESUR NEGATIVE 06/20/2017 2136   PROTEINUR Positive (A) 01/25/2020 0900   PROTEINUR 100 (A) 06/20/2017 2136   UROBILINOGEN 0.2 01/25/2020 0900   NITRITE neg 01/25/2020 0900   NITRITE NEGATIVE 06/20/2017 2136   LEUKOCYTESUR Large (3+) (A) 01/25/2020 0900    Lab Results  Component Value Date   BACTERIA FEW (A) 06/20/2017      Cystoscopy Procedure Note  Patient identification was confirmed, informed consent was obtained, and patient was prepped using Betadine solution.  Lidocaine jelly was administered per urethral meatus.     Pre-Procedure: - Inspection reveals a normal caliber ureteral meatus.  Procedure: The flexible cystoscope was introduced without difficulty - bulbar urethral stricture pinhole in size.    Post-Procedure: - Patient tolerated the procedure well   Pertinent Imaging:  No results found for this or any previous visit. No results found for this or any previous visit. No results found for this or any previous visit. No results found  for this or any previous visit. No results found for this or any previous visit. No results found for this or any previous visit. No results found for this or any previous visit. No results found for this or any previous visit.  Assessment & Plan:    1. 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 06/22/2017 for management of urethral stricture disease - BLADDER SCAN AMB NON-IMAGING  2. Epididymo-orchitis -bactrim DS BID for 7 days  3. Dysuria -likely related to bulbar urethral stricture - POCT urinalysis dipstick   No follow-ups on file.  Gillian Shields, MD  [facility] [place]

## 2020-01-25 NOTE — Patient Instructions (Signed)
Urethral Stricture  Urethral stricture is narrowing of the tube (urethra) that carries urine from the bladder out of the body. The urethra can become narrow due to scar tissue from an injury or infection. This can make it difficult to pass urine. In women, the urethra opens above the vaginal opening. In men, the urethra opens at the tip of the penis, and the urethra is much longer than it is in women. Because of the length of the male urethra, urethral stricture is much more common in men. This condition is treated with surgery. What are the causes? In both men and women, common causes of urethral stricture include:  Urinary tract infection (UTI).  Sexually transmitted infection (STI).  Use of a tube placed into the urethra to drain urine from the bladder (urinary catheter).  Urinary tract surgery. In men, common causes of urethral stricture include:  A severe injury to the pelvis.  Prostate surgery.  Injury to the penis. In many cases, the cause of urethral stricture is not known. What increases the risk? You are more likely to develop this condition if you:  Are male. Men who have had prostate surgery are at risk of developing this condition.  Use a urinary catheter.  Have had urinary tract surgery. What are the signs or symptoms? The main symptom of this condition is difficulty passing urine. This may cause decreased urine flow, dribbling, or spraying of urine. Other symptom of this condition may include:  Frequent UTIs.  Blood in the urine.  Pain when urinating.  Swelling of the penis in men.  Inability to pass urine (urinary obstruction). How is this diagnosed? This condition may be diagnosed based on:  Your medical history and a physical exam.  Urine tests to check for infection or bleeding.  X-rays.  Ultrasound.  Retrograde urethrogram. This is a type of test in which dye is injected into the urethra and then an X-ray is taken.  Urethroscopy. This is when  a thin tube with a light and camera on the end (urethroscope) is used to look at the urethra. How is this treated? This condition is treated with surgery. The type of surgery that you have depends on the severity of your condition. You may have:  Urethral dilation. In this procedure, the narrow part of the urethra is stretched open (dilated) with dilating instruments or a small balloon.  Urethrotomy. In this procedure, a urethroscope is placed into the urethra, and the narrow part of the urethra is cut open with a surgical blade inserted through the urethroscope.  Open surgery. In this procedure, an incision is made in the urethra, the narrow part is removed, and the urethra is reconstructed. Follow these instructions at home:   Take over-the-counter and prescription medicines only as told by your health care provider.  If you were prescribed an antibiotic medicine, take it as told by your health care provider. Do not stop taking the antibiotic even if you start to feel better.  Drink enough fluid to keep your urine pale yellow.  Keep all follow-up visits as told by your health care provider. This is important. Contact a health care provider if:  You have signs of a urinary tract infection, such as: ? Frequent urination or passing small amounts of urine frequently. ? Needing to urinate urgently. ? Pain or burning with urination. ? Urine that smells bad or unusual. ? Cloudy urine. ? Pain in the lower abdomen or back. ? Trouble urinating. ? Blood in the urine. ?   Vomiting or being less hungry than normal. ? Diarrhea or abdominal pain. ? Vaginal discharge, if you are male.  Your symptoms are getting worse instead of better. Get help right away if:  You cannot pass urine.  You have a fever.  You have swelling, bruising, or discoloration of your genital area. This includes the penis, scrotum, and inner thighs for men, and the outer genital organs (vulva) and inner thighs for  women.  You develop swelling in your legs.  You have difficulty breathing. Summary  Urethral stricture is narrowing of the tube (urethra) that carries urine from the bladder out of the body. The urethra can become narrow due to scar tissue from an injury or infection.  This condition can make it difficult to pass urine.  This condition is treated with surgery. The type of surgery that you have depends on the severity of your condition.  Contact a health care provider if your symptoms get worse or you have signs of a urinary tract infection. This information is not intended to replace advice given to you by your health care provider. Make sure you discuss any questions you have with your health care provider. Document Revised: 04/05/2018 Document Reviewed: 04/05/2018 Elsevier Patient Education  2020 [employer]  

## 2020-02-07 DIAGNOSIS — R339 Retention of urine, unspecified: Secondary | ICD-10-CM | POA: Diagnosis not present

## 2020-02-07 DIAGNOSIS — R300 Dysuria: Secondary | ICD-10-CM | POA: Diagnosis not present

## 2020-02-07 DIAGNOSIS — R3912 Poor urinary stream: Secondary | ICD-10-CM | POA: Diagnosis not present

## 2020-02-07 DIAGNOSIS — N35914 Unspecified anterior urethral stricture, male: Secondary | ICD-10-CM | POA: Diagnosis not present

## 2020-02-15 DIAGNOSIS — N35812 Other urethral bulbous stricture, male: Secondary | ICD-10-CM | POA: Diagnosis not present

## 2020-02-15 DIAGNOSIS — N35914 Unspecified anterior urethral stricture, male: Secondary | ICD-10-CM | POA: Diagnosis not present

## 2020-02-26 DIAGNOSIS — Z20822 Contact with and (suspected) exposure to covid-19: Secondary | ICD-10-CM | POA: Diagnosis not present

## 2020-02-26 DIAGNOSIS — Z01812 Encounter for preprocedural laboratory examination: Secondary | ICD-10-CM | POA: Diagnosis not present

## 2020-02-26 DIAGNOSIS — N35819 Other urethral stricture, male, unspecified site: Secondary | ICD-10-CM | POA: Diagnosis not present

## 2020-03-03 DIAGNOSIS — N35916 Unspecified urethral stricture, male, overlapping sites: Secondary | ICD-10-CM | POA: Diagnosis not present

## 2020-03-03 DIAGNOSIS — E669 Obesity, unspecified: Secondary | ICD-10-CM | POA: Diagnosis not present

## 2020-03-03 DIAGNOSIS — N35912 Unspecified bulbous urethral stricture, male: Secondary | ICD-10-CM | POA: Diagnosis not present

## 2020-03-03 DIAGNOSIS — R339 Retention of urine, unspecified: Secondary | ICD-10-CM | POA: Diagnosis not present

## 2020-03-03 DIAGNOSIS — R3912 Poor urinary stream: Secondary | ICD-10-CM | POA: Diagnosis not present

## 2020-03-03 DIAGNOSIS — N35919 Unspecified urethral stricture, male, unspecified site: Secondary | ICD-10-CM | POA: Diagnosis not present

## 2020-03-03 DIAGNOSIS — Z6841 Body Mass Index (BMI) 40.0 and over, adult: Secondary | ICD-10-CM | POA: Diagnosis not present

## 2020-03-27 DIAGNOSIS — Z9889 Other specified postprocedural states: Secondary | ICD-10-CM | POA: Diagnosis not present

## 2020-03-27 DIAGNOSIS — N35914 Unspecified anterior urethral stricture, male: Secondary | ICD-10-CM | POA: Diagnosis not present

## 2020-08-07 DIAGNOSIS — Z0000 Encounter for general adult medical examination without abnormal findings: Secondary | ICD-10-CM | POA: Diagnosis not present

## 2020-08-07 DIAGNOSIS — E7849 Other hyperlipidemia: Secondary | ICD-10-CM | POA: Diagnosis not present

## 2020-08-07 DIAGNOSIS — Z6841 Body Mass Index (BMI) 40.0 and over, adult: Secondary | ICD-10-CM | POA: Diagnosis not present

## 2020-08-07 DIAGNOSIS — E559 Vitamin D deficiency, unspecified: Secondary | ICD-10-CM | POA: Diagnosis not present

## 2020-10-02 DIAGNOSIS — N35814 Other anterior urethral stricture, male: Secondary | ICD-10-CM | POA: Diagnosis not present

## 2020-10-02 DIAGNOSIS — Z6841 Body Mass Index (BMI) 40.0 and over, adult: Secondary | ICD-10-CM | POA: Diagnosis not present

## 2021-08-11 DIAGNOSIS — R6889 Other general symptoms and signs: Secondary | ICD-10-CM | POA: Diagnosis not present

## 2021-08-11 DIAGNOSIS — R739 Hyperglycemia, unspecified: Secondary | ICD-10-CM | POA: Diagnosis not present

## 2021-08-11 DIAGNOSIS — Z13220 Encounter for screening for lipoid disorders: Secondary | ICD-10-CM | POA: Diagnosis not present

## 2021-08-11 DIAGNOSIS — Z1331 Encounter for screening for depression: Secondary | ICD-10-CM | POA: Diagnosis not present

## 2021-08-11 DIAGNOSIS — Z6841 Body Mass Index (BMI) 40.0 and over, adult: Secondary | ICD-10-CM | POA: Diagnosis not present

## 2021-08-11 DIAGNOSIS — Z0000 Encounter for general adult medical examination without abnormal findings: Secondary | ICD-10-CM | POA: Diagnosis not present

## 2021-10-30 DIAGNOSIS — Z87448 Personal history of other diseases of urinary system: Secondary | ICD-10-CM | POA: Diagnosis not present

## 2021-10-30 DIAGNOSIS — Z09 Encounter for follow-up examination after completed treatment for conditions other than malignant neoplasm: Secondary | ICD-10-CM | POA: Diagnosis not present

## 2022-01-12 ENCOUNTER — Ambulatory Visit
Admission: EM | Admit: 2022-01-12 | Discharge: 2022-01-12 | Disposition: A | Discharge status: Home / Self Care | Payer: BC Managed Care – PPO

## 2022-01-12 DIAGNOSIS — H66001 Acute suppurative otitis media without spontaneous rupture of ear drum, right ear: Secondary | ICD-10-CM | POA: Diagnosis not present

## 2022-01-12 DIAGNOSIS — R509 Fever, unspecified: Secondary | ICD-10-CM | POA: Diagnosis not present

## 2022-01-12 MED ORDER — AZITHROMYCIN 250 MG PO TABS: 250.0000 mg | ORAL_TABLET | Freq: Every day | ORAL | 0 refills | Status: DC

## 2022-01-12 NOTE — ED Provider Notes (Signed)
?[facility] ? ? ? ?CSN: [id number] ?Arrival date & time: 01/12/22  1034 ? ? ?  ? ?History   ?Chief Complaint ?Chief Complaint  ?Patient presents with  ? Headache  ? Fever  ? Nausea  ? ? ?HPI ?Ryan Berry is a 56 y.o. male.  ? ?Patient is a 56 year old male who presents with headache, fever, and nausea.  Symptoms have been persistent for the past 2 days.  Patient states that over the past 24 hours, he has had a "high fever" but he has not measured it.  He states his body has felt really hot and he has had difficulty staying warm.  He also complains of a persistent headache.  Headache has been over the right eye.  He states symptoms have not improved with ibuprofen or Tylenol that he has been taking for his fever.  He also complains of nausea.  He states that his symptoms have improved as he has been able to eat today without any symptoms.  He denies any sick contacts.  Denies any nasal congestion, cough, runny nose, or abdominal pain.  He also denies any urinary symptoms.   ? ?The history is provided by the patient.  ?Fever ?Associated symptoms: nausea   ?Associated symptoms: no diarrhea and no vomiting   ? ?Past Medical History:  ?Diagnosis Date  ? Asthma   ? as a child  ? GERD (gastroesophageal reflux disease)   ? at times  ? Hypertension   ? ? ?Patient Active Problem List  ? Diagnosis Date Noted  ? 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 Epididymo-orchitis 01/25/2020  ? ? ?Past Surgical History:  ?Procedure Laterality Date  ? CYSTOSCOPY WITH RETROGRADE URETHROGRAM N/A 09/29/2017  ? Procedure: CYSTOSCOPY WITH RETROGRADE URETHROGRAM/ DILATION OF STRICTURE WITH BALLOON;  Surgeon: Irine Seal, MD;  Location: [facility];  Service: Urology;  Laterality: N/A;  ? TIBIA FRACTURE SURGERY Right 2004-2005  ? broken leg   ? ? ? ? ? ?Home Medications   ? ?Prior to Admission medications   ?Medication Sig Start Date End Date Taking? Authorizing Provider  ?acetaminophen (TYLENOL) 500 MG  tablet Take 500 mg by mouth every 6 (six) hours as needed.   Yes [provider]  ?azithromycin (ZITHROMAX) 250 MG tablet Take 1 tablet (250 mg total) by mouth daily. Take first 2 tablets together, then 1 every day until finished. 01/12/22  Yes Nessa Ramaker-Warren, Alda Lea, NP  ?ibuprofen (ADVIL) 200 MG tablet Take 200 mg by mouth every 6 (six) hours as needed.   Yes [provider]  ?HYDROcodone-acetaminophen (NORCO) 5-325 MG tablet Take 1 tablet by mouth every 6 (six) hours as needed for moderate pain. 09/29/17   Irine Seal, MD  ?lisinopril (PRINIVIL,ZESTRIL) 20 MG tablet Take 20 mg by mouth daily.    [provider]  ?sulfamethoxazole-trimethoprim (BACTRIM DS) 800-160 MG tablet Take 1 tablet by mouth every 12 (twelve) hours. 01/25/20   McKenzie, Candee Furbish, MD  ? ? ?Family History ?Family History  ?Problem Relation Age of Onset  ? Heart attack Father   ? ? ?Social History ?Social History  ? ?Tobacco Use  ? Smoking status: Never  ? Smokeless tobacco: Current  ?  Types: Snuff  ?Vaping Use  ? Vaping Use: Never used  ?Substance Use Topics  ? Alcohol use: No  ? Drug use: No  ? ? ? ?Allergies   ?Penicillins ? ? ?Review of Systems ?Review of Systems  ?Constitutional:  Positive for fatigue  and fever.  ?HENT: Negative.    ?Eyes: Negative.   ?Respiratory: Negative.    ?Cardiovascular: Negative.   ?Gastrointestinal:  Positive for nausea. Negative for diarrhea and vomiting.  ?Skin: Negative.   ?Psychiatric/Behavioral: Negative.    ? ? ?Physical Exam ?Triage Vital Signs ?ED Triage Vitals [01/12/22 1148]  ?Enc Vitals Group  ?   BP (!) 149/95  ?   Pulse Rate (!) 115  ?   Resp 18  ?   Temp 98.6 ?F (37 ?C)  ?   Temp Source Oral  ?   SpO2 95 %  ?   Weight   ?   Height   ?   Head Circumference   ?   Peak Flow   ?   Pain Score   ?   Pain Loc   ?   Pain Edu?   ?   Excl. in [place]?   ? ?No data found. ? ?Updated Vital Signs ?BP (!) 149/95 (BP Location: Right Arm)   Pulse (!) 115   Temp 98.6 ?F (37 ?C) (Oral)   Resp  18   SpO2 95%  ? ?Visual Acuity ?Right Eye Distance:   ?Left Eye Distance:   ?Bilateral Distance:   ? ?Right Eye Near:   ?Left Eye Near:    ?Bilateral Near:    ? ?Physical Exam ?Vitals and nursing note reviewed.  ?Constitutional:   ?   General: He is not in acute distress. ?   Appearance: He is well-developed.  ?HENT:  ?   Head: Normocephalic.  ?   Right Ear: Ear canal and external ear normal. Tympanic membrane is erythematous and bulging.  ?   Left Ear: Hearing, ear canal and external ear normal. A middle ear effusion is present.  ?   Mouth/Throat:  ?   Mouth: Mucous membranes are moist.  ?Eyes:  ?   Extraocular Movements: Extraocular movements intact.  ?   Pupils: Pupils are equal, round, and reactive to light.  ?Cardiovascular:  ?   Rate and Rhythm: Regular rhythm. Tachycardia present.  ?   Heart sounds: Normal heart sounds.  ?Pulmonary:  ?   Effort: Pulmonary effort is normal.  ?   Breath sounds: Normal breath sounds.  ?Abdominal:  ?   General: Bowel sounds are normal.  ?   Tenderness: There is no abdominal tenderness.  ?Musculoskeletal:  ?   Cervical back: Normal range of motion.  ?Skin: ?   General: Skin is warm and dry.  ?   Capillary Refill: Capillary refill takes less than 2 seconds.  ?Neurological:  ?   Mental Status: He is alert and oriented to person, place, and time.  ?   GCS: GCS eye subscore is 4. GCS verbal subscore is 5. GCS motor subscore is 6.  ?Psychiatric:     ?   Mood and Affect: Mood normal.     ?   Behavior: Behavior normal.  ? ? ? ?UC Treatments / Results  ?Labs ?(all labs ordered are listed, but only abnormal results are displayed) ?Labs Reviewed  ?COVID-19, FLU A+B NAA  ? ? ?EKG ? ? ?Radiology ?No results found. ? ?Procedures ?Procedures (including critical care time) ? ?Medications Ordered in UC ?Medications - No data to display ? ?Initial Impression / Assessment and Plan / UC Course  ?I have reviewed the triage vital signs and the nursing notes. ? ?Pertinent labs & imaging results that  were available during my care of the patient were reviewed by me  and considered in my medical decision making (see chart for details). ? ?The patient is a 56 year old male who presents for fever, headache, and nausea.  Symptoms have been present for the past 2 days.  His reports that his last fever was 1 day ago.  He has not been able to measure his temperature at home, but has  stated that he feels warm and is experiencing chills.  His exam is mostly benign; however, he does have erythema and bulging of the right TM.  We will treat the patient for a right otitis media.  A COVID and influenza swab have also been collected today.  Patient advised that he will be contacted if these results are positive.  Patient advised to continue supportive care at this time, to increase fluids and get plenty of rest.  Patient advised to follow-up if symptoms do not improve. ?Final Clinical Impressions(s) / UC Diagnoses  ? ?Final diagnoses:  ?Fever, unspecified  ?Acute suppurative otitis media of right ear without spontaneous rupture of tympanic membrane, recurrence not specified  ? ? ? ?Discharge Instructions   ? ?  ?Take medication as prescribed. ?Your COVID and flu swab been collected today.  Your results should appear in your MyChart account within the next 24 hours.  If your results are positive, you will be contacted. ?Increase fluids and get plenty of rest. ?Continue ibuprofen and Tylenol as you are currently taking. ?If your symptoms do not improve, up as needed. ? ? ? ? ?ED Prescriptions   ? ? Medication Sig Dispense Auth. Provider  ? azithromycin (ZITHROMAX) 250 MG tablet Take 1 tablet (250 mg total) by mouth daily. Take first 2 tablets together, then 1 every day until finished. 6 tablet Colene Mines-Warren, Alda Lea, NP  ? ?  ? ?PDMP not reviewed this encounter. ?  ?Tish Men, NP ?01/12/22 1229 ? ?

## 2022-01-12 NOTE — ED Triage Notes (Signed)
Pt reports headache, chills, ever and nausea x 2 days.Tylenol and ibuprofen gives some relief.  ?

## 2022-01-12 NOTE — Discharge Instructions (Addendum)
Take medication as prescribed. ?Your COVID and flu swab been collected today.  Your results should appear in your MyChart account within the next 24 hours.  If your results are positive, you will be contacted. ?Increase fluids and get plenty of rest. ?Continue ibuprofen and Tylenol as you are currently taking. ?If your symptoms do not improve, up as needed. ?

## 2022-01-13 LAB — COVID-19, FLU A+B NAA
Influenza A, NAA: NOT DETECTED
Influenza B, NAA: NOT DETECTED
SARS-CoV-2, NAA: NOT DETECTED

## 2022-04-29 DIAGNOSIS — R351 Nocturia: Secondary | ICD-10-CM | POA: Diagnosis not present

## 2022-04-29 DIAGNOSIS — R3914 Feeling of incomplete bladder emptying: Secondary | ICD-10-CM | POA: Diagnosis not present

## 2022-04-29 DIAGNOSIS — R3912 Poor urinary stream: Secondary | ICD-10-CM | POA: Diagnosis not present

## 2022-04-29 DIAGNOSIS — Z87448 Personal history of other diseases of urinary system: Secondary | ICD-10-CM | POA: Diagnosis not present

## 2022-06-04 DIAGNOSIS — Z6841 Body Mass Index (BMI) 40.0 and over, adult: Secondary | ICD-10-CM | POA: Diagnosis not present

## 2022-06-04 DIAGNOSIS — N35914 Unspecified anterior urethral stricture, male: Secondary | ICD-10-CM | POA: Diagnosis not present

## 2022-06-16 DIAGNOSIS — Z6841 Body Mass Index (BMI) 40.0 and over, adult: Secondary | ICD-10-CM | POA: Diagnosis not present

## 2022-06-16 DIAGNOSIS — E669 Obesity, unspecified: Secondary | ICD-10-CM | POA: Diagnosis not present

## 2022-06-16 DIAGNOSIS — N35912 Unspecified bulbous urethral stricture, male: Secondary | ICD-10-CM | POA: Diagnosis not present

## 2022-06-16 DIAGNOSIS — Z87891 Personal history of nicotine dependence: Secondary | ICD-10-CM | POA: Diagnosis not present

## 2022-06-16 DIAGNOSIS — N35914 Unspecified anterior urethral stricture, male: Secondary | ICD-10-CM | POA: Diagnosis not present

## 2022-06-16 DIAGNOSIS — I10 Essential (primary) hypertension: Secondary | ICD-10-CM | POA: Diagnosis not present

## 2022-06-21 DIAGNOSIS — Z9889 Other specified postprocedural states: Secondary | ICD-10-CM | POA: Diagnosis not present

## 2022-06-21 DIAGNOSIS — R3912 Poor urinary stream: Secondary | ICD-10-CM | POA: Diagnosis not present

## 2022-08-16 DIAGNOSIS — Z13220 Encounter for screening for lipoid disorders: Secondary | ICD-10-CM | POA: Diagnosis not present

## 2022-08-16 DIAGNOSIS — Z0001 Encounter for general adult medical examination with abnormal findings: Secondary | ICD-10-CM | POA: Diagnosis not present

## 2022-08-16 DIAGNOSIS — Z6841 Body Mass Index (BMI) 40.0 and over, adult: Secondary | ICD-10-CM | POA: Diagnosis not present

## 2022-08-16 DIAGNOSIS — R7309 Other abnormal glucose: Secondary | ICD-10-CM | POA: Diagnosis not present

## 2022-08-16 DIAGNOSIS — I10 Essential (primary) hypertension: Secondary | ICD-10-CM | POA: Diagnosis not present

## 2022-09-09 DIAGNOSIS — N35914 Unspecified anterior urethral stricture, male: Secondary | ICD-10-CM | POA: Diagnosis not present

## 2022-09-09 DIAGNOSIS — Z6841 Body Mass Index (BMI) 40.0 and over, adult: Secondary | ICD-10-CM | POA: Diagnosis not present

## 2022-10-29 ENCOUNTER — Ambulatory Visit
Admission: EM | Admit: 2022-10-29 | Discharge: 2022-10-29 | Disposition: A | Discharge status: Home / Self Care | Payer: BC Managed Care – PPO | Attending: Nurse Practitioner | Admitting: Nurse Practitioner

## 2022-10-29 DIAGNOSIS — R52 Pain, unspecified: Secondary | ICD-10-CM

## 2022-10-29 DIAGNOSIS — R051 Acute cough: Secondary | ICD-10-CM | POA: Diagnosis not present

## 2022-10-29 LAB — POCT INFLUENZA A/B
Influenza A, POC: NEGATIVE
Influenza B, POC: NEGATIVE

## 2022-10-29 MED ORDER — BENZONATATE 100 MG PO CAPS: 100.0000 mg | ORAL_CAPSULE | Freq: Three times a day (TID) | ORAL | 0 refills | Status: AC | PRN

## 2022-10-29 MED ORDER — ALBUTEROL SULFATE HFA 108 (90 BASE) MCG/ACT IN AERS: 1.0000 | INHALATION_SPRAY | Freq: Four times a day (QID) | RESPIRATORY_TRACT | 0 refills | Status: DC | PRN

## 2022-10-29 NOTE — ED Provider Notes (Signed)
[facility]    CSN: [id number] Arrival date & time: 10/29/22  1012      History   Chief Complaint Chief Complaint  Patient presents with   Cough   Shortness of Breath    HPI Ryan Berry is a 57 y.o. male.   HPI  He is in today for nonproductive cough, shortness, body aches for 3 days. He has nasal congestion. Exposure positive/negative COVID/Influenza/Strep.  Denies fever, chills,  sore throat, chest pain, nausea, or diarrhea. No current treatment has been OTC. He had a cough a few weeks ago that caused left sided rib pain.  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 Authorizing Provider  albuterol (VENTOLIN HFA) 108 (90 Base) MCG/ACT inhaler Inhale 1-2 puffs into the lungs every 6 (six) hours as needed for wheezing or shortness of breath. 10/29/22  Yes Vevelyn Francois, NP  benzonatate (TESSALON) 100 MG capsule Take 1 capsule (100 mg total) by mouth 3 (three) times daily as needed for up to 10 days for cough. Never suck or chew on a benzonatate capsule. 10/29/22 11/08/22 Yes Harlene Petralia, Diona Foley, NP  lisinopril (PRINIVIL,ZESTRIL) 20 MG tablet Take 20 mg by mouth daily.   Yes [provider]  acetaminophen (TYLENOL) 500 MG tablet Take 500 mg by mouth every 6 (six) hours as  needed.    [provider]  ibuprofen (ADVIL) 200 MG tablet Take 200 mg by mouth every 6 (six) hours as needed.    [provider]    Family History Family History  Problem Relation Age of Onset   Heart attack Father     Social History Social History   Tobacco Use   Smoking status: Never   Smokeless tobacco: Current    Types: Snuff  Vaping Use   Vaping Use: Never used  Substance Use Topics   Alcohol use: No   Drug use: No     Allergies   Penicillins   Review of Systems Review of Systems   Physical Exam Triage Vital Signs ED Triage Vitals  Enc Vitals Group     BP 10/29/22 1025 (!) 147/85     Pulse Rate 10/29/22 1025 95     Resp 10/29/22 1025 16     Temp 10/29/22 1025 98.9 F (37.2 C)     Temp Source 10/29/22 1025 Oral     SpO2 10/29/22 1025 97 %     Weight --      Height --      Head Circumference --      Peak Flow --      Pain Score 10/29/22 1026 0     Pain Loc --      Pain Edu? --  Excl. in GC? --    No data found.  Updated Vital Signs BP (!) 147/85 (BP Location: Right Arm)   Pulse 95   Temp 98.9 F (37.2 C) (Oral)   Resp 16   SpO2 97%   Visual Acuity Right Eye Distance:   Left Eye Distance:   Bilateral Distance:    Right Eye Near:   Left Eye Near:    Bilateral Near:     Physical Exam Constitutional:      General: He is not in acute distress.    Appearance: He is obese.  HENT:     Head: Normocephalic and atraumatic.     Mouth/Throat:     Mouth: Mucous membranes are moist.  Eyes:     Pupils: Pupils are equal, round, and reactive to light.  Cardiovascular:     Rate and Rhythm: Normal rate and regular rhythm.  Pulmonary:     Effort: Pulmonary effort is normal.     Breath sounds: Examination of the right-upper field reveals decreased breath sounds. Examination of the left-upper field reveals decreased breath sounds. Examination of the right-middle field reveals decreased breath sounds. Examination of the  left-middle field reveals decreased breath sounds. Examination of the right-lower field reveals decreased breath sounds. Examination of the left-lower field reveals decreased breath sounds. Decreased breath sounds present. No wheezing, rhonchi or rales.  Musculoskeletal:        General: Normal range of motion.     Cervical back: Normal range of motion and neck supple.  Skin:    General: Skin is warm and dry.     Capillary Refill: Capillary refill takes less than 2 seconds.  Neurological:     General: No focal deficit present.     Mental Status: He is alert and oriented to person, place, and time.  Psychiatric:        Mood and Affect: Mood normal.        Behavior: Behavior normal.     UC Treatments / Results  Labs (all labs ordered are listed, but only abnormal results are displayed) Labs Reviewed  POCT INFLUENZA A/B    EKG   Radiology No results found.  Procedures Procedures (including critical care time)  Medications Ordered in UC Medications - No data to display  Initial Impression / Assessment and Plan / UC Course  I have reviewed the triage vital signs and the nursing notes.  Pertinent labs & imaging results that were available during my care of the patient were reviewed by me and considered in my medical decision making (see chart for details).     cough Final Clinical Impressions(s) / UC Diagnoses   Final diagnoses:  Acute cough  Body aches     Discharge Instructions      Your Influenza is negative. You have a viral illness. You have diminished lung sounds which is consistent with your asthma history . You have been prescribed an Albuterol inhaler. This will help with the shortness of breath as needed,  Recommendation is OTC Mucinex DM for the congestion.   Hydrate well with water. You have been prescribed Benzonatate 100 mg every 8 hours for the cough. We encourage conservative treatment with symptom relief. We encourage you to use Tylenol for your  body aches (Remember to use as directed do not exceed daily dosing recommendations).      ED Prescriptions     Medication Sig Dispense Auth. Provider   albuterol (VENTOLIN HFA) 108 (90 Base) MCG/ACT inhaler Inhale 1-2 puffs into  the lungs every 6 (six) hours as needed for wheezing or shortness of breath. 6.7 g Vevelyn Francois, NP   benzonatate (TESSALON) 100 MG capsule Take 1 capsule (100 mg total) by mouth 3 (three) times daily as needed for up to 10 days for cough. Never suck or chew on a benzonatate capsule. 30 capsule Vevelyn Francois, NP      PDMP not reviewed this encounter.   Dionisio David [place], NP 10/29/22 1213

## 2022-10-29 NOTE — Discharge Instructions (Addendum)
Your Influenza is negative. You have a viral illness. You have diminished lung sounds which is consistent with your asthma history . You have been prescribed an Albuterol inhaler. This will help with the shortness of breath as needed,  Recommendation is OTC Mucinex DM for the congestion.   Hydrate well with water. You have been prescribed Benzonatate 100 mg every 8 hours for the cough. We encourage conservative treatment with symptom relief. We encourage you to use Tylenol for your body aches (Remember to use as directed do not exceed daily dosing recommendations).

## 2022-10-29 NOTE — ED Triage Notes (Signed)
Patient states he is having cough, SOB, body aches. Started Tuesday. Not taking any OTC mediation.

## 2023-01-13 ENCOUNTER — Ambulatory Visit
Admission: EM | Admit: 2023-01-13 | Discharge: 2023-01-13 | Disposition: A | Discharge status: Home / Self Care | Payer: BC Managed Care – PPO | Attending: Nurse Practitioner | Admitting: Nurse Practitioner

## 2023-01-13 DIAGNOSIS — H66001 Acute suppurative otitis media without spontaneous rupture of ear drum, right ear: Secondary | ICD-10-CM | POA: Diagnosis not present

## 2023-01-13 MED ORDER — CEFDINIR 300 MG PO CAPS: 300.0000 mg | ORAL_CAPSULE | Freq: Two times a day (BID) | ORAL | 0 refills | Status: AC

## 2023-01-13 MED ORDER — BENZONATATE 100 MG PO CAPS: 100.0000 mg | ORAL_CAPSULE | Freq: Three times a day (TID) | ORAL | 0 refills | Status: AC | PRN

## 2023-01-13 NOTE — ED Provider Notes (Signed)
[facility]    CSN: [id number] Arrival date & time: 01/13/23  1660      History   Chief Complaint No chief complaint on file.   HPI Ryan Berry is a 57 y.o. male.   Patient presents today for 2-day history of low-grade fevers, body aches and chills, congested cough, chest pain when he coughs, runny and stuffy nose, sore throat, ear pain worse in the right ear, 3 episodes of vomiting, and diarrhea.  Also reports decreased appetite and fatigue.  No shortness of breath, abdominal pain, recurrent nausea.  Reports vomiting bile yesterday.  Has had 2 episodes of soft diarrhea today.  Reports she has been able to tolerate liquids today.  No known sick contacts.  Has taken Tylenol which does seem to help with symptoms temporarily.  Patient reports he is allergic to penicillin antibiotics.  Reports he has tolerated Keflex well in the past.    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 Authorizing Provider  benzonatate (TESSALON) 100 MG capsule Take 1 capsule (100 mg total) by mouth 3 (three) times daily as needed for cough. Do not take with alcohol or while driving or operating heavy machinery.  May cause drowsiness. 01/13/23  Yes Valentino Nose, NP  cefdinir (OMNICEF) 300 MG capsule Take  1 capsule (300 mg total) by mouth 2 (two) times daily for 5 days. 01/13/23 01/18/23 Yes Valentino Nose, NP  acetaminophen (TYLENOL) 500 MG tablet Take 500 mg by mouth every 6 (six) hours as needed.    [provider]  albuterol (VENTOLIN HFA) 108 (90 Base) MCG/ACT inhaler Inhale 1-2 puffs into the lungs every 6 (six) hours as needed for wheezing or shortness of breath. 10/29/22   Barbette Merino, NP  ibuprofen (ADVIL) 200 MG tablet Take 200 mg by mouth every 6 (six) hours as needed.    [provider]  lisinopril (PRINIVIL,ZESTRIL) 20 MG tablet Take 20 mg by mouth daily.    [provider]    Family History Family History  Problem Relation Age of Onset   Heart attack Father     Social History Social History   Tobacco Use   Smoking status: Never   Smokeless tobacco: Current    Types: Snuff  Vaping Use   Vaping Use: Never used  Substance Use Topics   Alcohol use: No   Drug use: No     Allergies   Penicillins   Review of Systems Review of Systems Per HPI  Physical Exam Triage Vital Signs ED Triage Vitals  Enc Vitals Group     BP 01/13/23 0954 118/79     Pulse Rate 01/13/23 0954 88     Resp 01/13/23 0954 18  Temp 01/13/23 0954 98.4 F (36.9 C)     Temp Source 01/13/23 0954 Oral     SpO2 --      Weight --      Height --      Head Circumference --      Peak Flow --      Pain Score 01/13/23 0953 5     Pain Loc --      Pain Edu? --      Excl. in GC? --    No data found.  Updated Vital Signs BP 118/79 (BP Location: Right Arm)   Pulse 88   Temp 98.4 F (36.9 C) (Oral)   Resp 18   SpO2 94% Comment: recheck by provider.  Visual Acuity Right Eye Distance:   Left Eye Distance:   Bilateral Distance:    Right Eye Near:   Left Eye Near:    Bilateral Near:     Physical Exam Vitals and nursing note reviewed.  Constitutional:      General: He is not in acute distress.    Appearance: Normal appearance. He is not ill-appearing or  toxic-appearing.  HENT:     Head: Normocephalic and atraumatic.     Right Ear: Ear canal and external ear normal. Tympanic membrane is erythematous.     Left Ear: Tympanic membrane, ear canal and external ear normal.     Nose: No congestion or rhinorrhea.     Mouth/Throat:     Mouth: Mucous membranes are moist.     Pharynx: Oropharynx is clear. Posterior oropharyngeal erythema present. No oropharyngeal exudate.  Eyes:     General: No scleral icterus.    Extraocular Movements: Extraocular movements intact.  Cardiovascular:     Rate and Rhythm: Normal rate and regular rhythm.  Pulmonary:     Effort: Pulmonary effort is normal. No respiratory distress.     Breath sounds: Normal breath sounds. No wheezing, rhonchi or rales.  Abdominal:     General: Abdomen is flat. Bowel sounds are increased. There is no distension.     Palpations: Abdomen is soft.     Tenderness: There is no abdominal tenderness. There is no right CVA tenderness, left CVA tenderness, guarding or rebound.  Musculoskeletal:     Cervical back: Normal range of motion and neck supple.  Lymphadenopathy:     Cervical: No cervical adenopathy.  Skin:    General: Skin is warm and dry.     Coloration: Skin is not jaundiced or pale.     Findings: No erythema or rash.  Neurological:     Mental Status: He is alert and oriented to person, place, and time.  Psychiatric:        Behavior: Behavior is cooperative.      UC Treatments / Results  Labs (all labs ordered are listed, but only abnormal results are displayed) Labs Reviewed - No data to display  EKG   Radiology No results found.  Procedures Procedures (including critical care time)  Medications Ordered in UC Medications - No data to display  Initial Impression / Assessment and Plan / UC Course  I have reviewed the triage vital signs and the nursing notes.  Pertinent labs & imaging results that were available during my care of the patient were reviewed by me  and considered in my medical decision making (see chart for details).  Patient is well-appearing, normotensive, afebrile, not tachycardic, not tachypneic, oxygenating well on room air.    1. Non-recurrent acute suppurative otitis media  of right ear without spontaneous rupture of tympanic membrane Suspect symptoms are secondary to viral upper respiratory infection Patient declined COVID-19 testing today Will treat ear infection with cefdinir twice daily for 5 days Supportive care discussed with patient-start cough suppressant medication ER and return precautions discussed Note given for work  The patient was given the opportunity to ask questions.  All questions answered to their satisfaction.  The patient is in agreement to this plan.    Final Clinical Impressions(s) / UC Diagnoses   Final diagnoses:  Non-recurrent acute suppurative otitis media of right ear without spontaneous rupture of tympanic membrane     Discharge Instructions      You have an ear infection in your right ear.  Please take the cefdinir as prescribed to treat it.  Symptoms should improve over the next week to 10 days.  If you develop chest pain or shortness of breath, go to the emergency room.  Some things that can make you feel better are: - Increased rest - Increasing fluid with water/sugar free electrolytes - Acetaminophen and ibuprofen as needed for fever/pain - Salt water gargling, chloraseptic spray and throat lozenges - OTC guaifenesin (Mucinex) 600 mg twice daily - Saline sinus flushes or a neti pot - Humidifying the air -Tessalon Perles during the day as needed for dry cough      ED Prescriptions     Medication Sig Dispense Auth. Provider   cefdinir (OMNICEF) 300 MG capsule Take 1 capsule (300 mg total) by mouth 2 (two) times daily for 5 days. 10 capsule Cathlean Marseilles A, NP   benzonatate (TESSALON) 100 MG capsule Take 1 capsule (100 mg total) by mouth 3 (three) times daily as needed for  cough. Do not take with alcohol or while driving or operating heavy machinery.  May cause drowsiness. 21 capsule Valentino Nose, NP      PDMP not reviewed this encounter.   Valentino Nose, NP 01/13/23 [phone]

## 2023-01-13 NOTE — Discharge Instructions (Addendum)
You have an ear infection in your right ear.  Please take the cefdinir as prescribed to treat it.  Symptoms should improve over the next week to 10 days.  If you develop chest pain or shortness of breath, go to the emergency room.  Some things that can make you feel better are: - Increased rest - Increasing fluid with water/sugar free electrolytes - Acetaminophen and ibuprofen as needed for fever/pain - Salt water gargling, chloraseptic spray and throat lozenges - OTC guaifenesin (Mucinex) 600 mg twice daily - Saline sinus flushes or a neti pot - Humidifying the air -Tessalon Perles during the day as needed for dry cough

## 2023-01-13 NOTE — ED Triage Notes (Signed)
Pt reports N/V, fever, diarrhea, body aches fatigue x 2 days . Took tylenol but only slight relief.

## 2023-03-11 DIAGNOSIS — N35913 Unspecified membranous urethral stricture, male: Secondary | ICD-10-CM | POA: Diagnosis not present

## 2023-03-11 DIAGNOSIS — Z6841 Body Mass Index (BMI) 40.0 and over, adult: Secondary | ICD-10-CM | POA: Diagnosis not present

## 2023-07-27 ENCOUNTER — Encounter: Payer: Self-pay | Admitting: Emergency Medicine

## 2023-07-27 ENCOUNTER — Ambulatory Visit
Admission: EM | Admit: 2023-07-27 | Discharge: 2023-07-27 | Disposition: A | Discharge status: Home / Self Care | Payer: BC Managed Care – PPO | Attending: Family Medicine | Admitting: Family Medicine

## 2023-07-27 ENCOUNTER — Other Ambulatory Visit: Payer: Self-pay

## 2023-07-27 DIAGNOSIS — R110 Nausea: Secondary | ICD-10-CM

## 2023-07-27 DIAGNOSIS — R5383 Other fatigue: Secondary | ICD-10-CM | POA: Diagnosis not present

## 2023-07-27 DIAGNOSIS — R52 Pain, unspecified: Secondary | ICD-10-CM | POA: Diagnosis not present

## 2023-07-27 DIAGNOSIS — R509 Fever, unspecified: Secondary | ICD-10-CM | POA: Diagnosis not present

## 2023-07-27 LAB — POC COVID19/FLU A&B COMBO
Covid Antigen, POC: NEGATIVE
Influenza A Antigen, POC: NEGATIVE
Influenza B Antigen, POC: NEGATIVE

## 2023-07-27 MED ORDER — ONDANSETRON 4 MG PO TBDP: 4.0000 mg | ORAL_TABLET | Freq: Three times a day (TID) | ORAL | 0 refills | Status: AC | PRN

## 2023-07-27 NOTE — ED Triage Notes (Signed)
Pt reports body aches, fever, nausea since Monday.

## 2023-07-27 NOTE — ED Provider Notes (Signed)
[facility]    CSN: [id number] Arrival date & time: 07/27/23  1142      History   Chief Complaint Chief Complaint  Patient presents with   Fever    HPI Ryan Berry is a 57 y.o. male.   Presenting today with fever, fatigue, body aches, nausea for the past 3 days.  Denies cough, congestion, chest pain, shortness of breath, vomiting, diarrhea, sick contacts.  So far not trying anything over-the-counter for symptoms.    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 Authorizing Provider  ondansetron (ZOFRAN-ODT) 4 MG disintegrating tablet Take 1 tablet (4 mg total) by mouth every 8 (eight) hours as needed for nausea or vomiting. 07/27/23  Yes Particia Nearing, PA-C  acetaminophen (TYLENOL) 500 MG tablet Take 500 mg by mouth every 6 (six) hours as needed.    [provider]  albuterol (VENTOLIN HFA) 108 (90 Base) MCG/ACT inhaler Inhale 1-2 puffs into the lungs every 6 (six) hours as needed for wheezing or shortness of breath. 10/29/22   Barbette Merino, NP  benzonatate (TESSALON) 100 MG capsule Take 1 capsule (100 mg total) by mouth 3 (three) times daily as needed for cough. Do not take with alcohol or while driving or operating heavy machinery.  May cause drowsiness.  01/13/23   Valentino Nose, NP  ibuprofen (ADVIL) 200 MG tablet Take 200 mg by mouth every 6 (six) hours as needed.    [provider]  lisinopril (PRINIVIL,ZESTRIL) 20 MG tablet Take 20 mg by mouth daily.    [provider]    Family History Family History  Problem Relation Age of Onset   Heart attack Father     Social History Social History   Tobacco Use   Smoking status: Never   Smokeless tobacco: Current    Types: Snuff  Vaping Use   Vaping status: Never Used  Substance Use Topics   Alcohol use: No   Drug use: No     Allergies   Penicillins   Review of Systems Review of Systems PER HPI  Physical Exam Triage Vital Signs ED Triage Vitals [07/27/23 1343]  Encounter Vitals Group     BP 131/84     Systolic BP Percentile      Diastolic BP Percentile      Pulse Rate 98     Resp 20     Temp 98.9 F (37.2 C)     Temp Source Oral     SpO2 95 %     Weight      Height      Head Circumference      Peak Flow      Pain Score      Pain Loc  Pain Education      Exclude from Growth Chart    No data found.  Updated Vital Signs BP 131/84 (BP Location: Right Arm)   Pulse 98   Temp 98.9 F (37.2 C) (Oral)   Resp 20   SpO2 95%   Visual Acuity Right Eye Distance:   Left Eye Distance:   Bilateral Distance:    Right Eye Near:   Left Eye Near:    Bilateral Near:     Physical Exam Vitals and nursing note reviewed.  Constitutional:      Appearance: Normal appearance.  HENT:     Head: Atraumatic.     Nose: Nose normal.     Mouth/Throat:     Mouth: Mucous membranes are moist.     Pharynx: Oropharynx is clear.  Eyes:     Extraocular Movements: Extraocular movements intact.     Conjunctiva/sclera: Conjunctivae normal.  Cardiovascular:     Rate and Rhythm: Normal rate and regular rhythm.  Pulmonary:     Effort: Pulmonary effort is normal.     Breath sounds: Normal breath sounds.  Abdominal:     General: Bowel sounds are normal.  There is no distension.     Palpations: Abdomen is soft.     Tenderness: There is no abdominal tenderness. There is no right CVA tenderness, left CVA tenderness or guarding.  Musculoskeletal:        General: Normal range of motion.     Cervical back: Normal range of motion and neck supple.  Skin:    General: Skin is warm and dry.  Neurological:     General: No focal deficit present.     Mental Status: He is oriented to person, place, and time.  Psychiatric:        Mood and Affect: Mood normal.        Thought Content: Thought content normal.        Judgment: Judgment normal.      UC Treatments / Results  Labs (all labs ordered are listed, but only abnormal results are displayed) Labs Reviewed  POC COVID19/FLU A&B COMBO    EKG   Radiology No results found.  Procedures Procedures (including critical care time)  Medications Ordered in UC Medications - No data to display  Initial Impression / Assessment and Plan / UC Course  I have reviewed the triage vital signs and the nursing notes.  Pertinent labs & imaging results that were available during my care of the patient were reviewed by me and considered in my medical decision making (see chart for details).     Vitals and exam reassuring today with no red flag findings.  Suspect viral illness.  Rapid COVID and flu negative.  Discussed Zofran, brat diet, fluids, rest.  Work note given.  Return for worsening symptoms.  Final Clinical Impressions(s) / UC Diagnoses   Final diagnoses:  Fever, unspecified  Nausea without vomiting  Other fatigue  Generalized body aches     Discharge Instructions      I suspect your symptoms to be related to a viral illness.  Stay well-hydrated, get plenty of rest, eat bland foods and I have sent over a nausea medication for as needed use.  Over-the-counter pain relievers for body aches and fever.  Your COVID and flu testing here is negative.  Follow-up for worsening symptoms.    ED  Prescriptions     Medication Sig Dispense Auth. Provider   ondansetron (ZOFRAN-ODT) 4 MG disintegrating tablet Take 1 tablet (  4 mg total) by mouth every 8 (eight) hours as needed for nausea or vomiting. 20 tablet Particia Nearing, [place]      PDMP not reviewed this encounter.   Particia Nearing, [place] 07/27/23 1422

## 2023-07-27 NOTE — Discharge Instructions (Addendum)
I suspect your symptoms to be related to a viral illness.  Stay well-hydrated, get plenty of rest, eat bland foods and I have sent over a nausea medication for as needed use.  Over-the-counter pain relievers for body aches and fever.  Your COVID and flu testing here is negative.  Follow-up for worsening symptoms.

## 2023-08-19 DIAGNOSIS — R739 Hyperglycemia, unspecified: Secondary | ICD-10-CM | POA: Diagnosis not present

## 2023-08-19 DIAGNOSIS — I10 Essential (primary) hypertension: Secondary | ICD-10-CM | POA: Diagnosis not present

## 2023-08-19 DIAGNOSIS — Z6841 Body Mass Index (BMI) 40.0 and over, adult: Secondary | ICD-10-CM | POA: Diagnosis not present

## 2023-08-19 DIAGNOSIS — R748 Abnormal levels of other serum enzymes: Secondary | ICD-10-CM | POA: Diagnosis not present

## 2023-08-19 DIAGNOSIS — Z0001 Encounter for general adult medical examination with abnormal findings: Secondary | ICD-10-CM | POA: Diagnosis not present

## 2023-08-19 DIAGNOSIS — Z1331 Encounter for screening for depression: Secondary | ICD-10-CM | POA: Diagnosis not present

## 2023-08-22 ENCOUNTER — Other Ambulatory Visit (HOSPITAL_COMMUNITY): Payer: Self-pay | Admitting: Internal Medicine

## 2023-08-22 DIAGNOSIS — R748 Abnormal levels of other serum enzymes: Secondary | ICD-10-CM

## 2024-03-13 DIAGNOSIS — Z6841 Body Mass Index (BMI) 40.0 and over, adult: Secondary | ICD-10-CM | POA: Diagnosis not present

## 2024-03-13 DIAGNOSIS — R609 Edema, unspecified: Secondary | ICD-10-CM | POA: Diagnosis not present

## 2024-03-13 DIAGNOSIS — I10 Essential (primary) hypertension: Secondary | ICD-10-CM | POA: Diagnosis not present

## 2024-03-13 DIAGNOSIS — N419 Inflammatory disease of prostate, unspecified: Secondary | ICD-10-CM | POA: Diagnosis not present

## 2024-03-13 DIAGNOSIS — Z9229 Personal history of other drug therapy: Secondary | ICD-10-CM | POA: Diagnosis not present

## 2024-03-14 DIAGNOSIS — Z6841 Body Mass Index (BMI) 40.0 and over, adult: Secondary | ICD-10-CM | POA: Diagnosis not present

## 2024-03-14 DIAGNOSIS — E66813 Obesity, class 3: Secondary | ICD-10-CM | POA: Diagnosis not present

## 2024-03-14 DIAGNOSIS — N35913 Unspecified membranous urethral stricture, male: Secondary | ICD-10-CM | POA: Diagnosis not present

## 2024-07-27 DIAGNOSIS — Z13220 Encounter for screening for lipoid disorders: Secondary | ICD-10-CM | POA: Diagnosis not present

## 2024-07-27 DIAGNOSIS — I10 Essential (primary) hypertension: Secondary | ICD-10-CM | POA: Diagnosis not present

## 2024-07-27 DIAGNOSIS — Z6841 Body Mass Index (BMI) 40.0 and over, adult: Secondary | ICD-10-CM | POA: Diagnosis not present

## 2024-07-27 DIAGNOSIS — Z136 Encounter for screening for cardiovascular disorders: Secondary | ICD-10-CM | POA: Diagnosis not present

## 2024-07-27 DIAGNOSIS — Z131 Encounter for screening for diabetes mellitus: Secondary | ICD-10-CM | POA: Diagnosis not present

## 2024-08-15 ENCOUNTER — Ambulatory Visit
Admission: EM | Admit: 2024-08-15 | Discharge: 2024-08-15 | Disposition: A | Discharge status: Home / Self Care | Attending: Nurse Practitioner | Admitting: Nurse Practitioner

## 2024-08-15 DIAGNOSIS — R059 Cough, unspecified: Secondary | ICD-10-CM

## 2024-08-15 MED ORDER — PREDNISONE 20 MG PO TABS: 40.0000 mg | ORAL_TABLET | Freq: Every day | ORAL | 0 refills | Status: AC

## 2024-08-15 MED ORDER — PROMETHAZINE-DM 6.25-15 MG/5ML PO SYRP: 5.0000 mL | ORAL_SOLUTION | Freq: Four times a day (QID) | ORAL | 0 refills | Status: AC | PRN

## 2024-08-15 MED ORDER — ALBUTEROL SULFATE HFA 108 (90 BASE) MCG/ACT IN AERS: 2.0000 | INHALATION_SPRAY | Freq: Four times a day (QID) | RESPIRATORY_TRACT | 0 refills | Status: AC | PRN

## 2024-08-15 NOTE — ED Provider Notes (Signed)
 [facility]    CSN: [id number] Arrival date & time: 08/15/24  1538      History   Chief Complaint No chief complaint on file.   HPI Ryan Berry is a 58 y.o. male.   The history is provided by the patient.   Patient presents with a 1 week history of cough.  Patient states that he also had fever last evening around 100, but that has since resolved.  He also endorses some nasal congestion and runny nose.  Denies headache, ear pain, wheezing, difficulty breathing, abdominal pain, diarrhea, or constipation.  States that he did have a Coughing episode last evening that caused him to become dizzy and vomit.  Patient states that he has been using cough drops for his symptoms.  Patient with underlying history of asthma as a child, states he has not had any recent exacerbations.  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 Authorizing Provider  albuterol  (VENTOLIN  HFA) 108 (90 Base) MCG/ACT inhaler Inhale 2 puffs into the lungs every 6 (six) hours as needed. 08/15/24  Yes Leath-Warren, Etta PARAS, NP  predniSONE (DELTASONE) 20 MG tablet Take 2 tablets (40 mg total) by mouth daily with breakfast for 5 days. 08/15/24 08/20/24 Yes Leath-Warren, Etta PARAS, NP  promethazine -dextromethorphan  (PROMETHAZINE -DM) 6.25-15 MG/5ML syrup Take 5 mLs by mouth 4 (four) times daily as needed. 08/15/24  Yes Leath-Warren, Etta PARAS, NP  acetaminophen  (TYLENOL ) 500 MG tablet Take 500 mg by mouth every 6 (six) hours as needed.    [provider]  benzonatate  (TESSALON ) 100 MG capsule Take 1 capsule (100 mg total) by mouth 3 (three) times daily as needed for cough. Do not take with alcohol or while driving or operating heavy machinery.  May cause drowsiness. 01/13/23   Chandra Harlene LABOR, NP  ibuprofen (ADVIL) 200 MG tablet Take 200 mg by mouth every 6 (six) hours as needed.    [provider]  lisinopril (PRINIVIL,ZESTRIL) 20 MG tablet Take 20 mg by mouth daily.    [provider]  ondansetron  (ZOFRAN -ODT) 4 MG disintegrating tablet Take 1 tablet (4 mg total) by mouth every 8 (eight) hours as needed for nausea or vomiting. 07/27/23   Stuart Vernell Norris, PA-C    Family History Family History  Problem Relation Age of Onset   Heart attack Father     Social History Social History   Tobacco Use   Smoking status: Never   Smokeless tobacco: Current    Types: Snuff  Vaping Use   Vaping status: Never Used  Substance Use Topics   Alcohol use: No   Drug use: No     Allergies   Penicillins   Review of Systems Review of Systems Per HPI  Physical  Exam Triage Vital Signs ED Triage Vitals  Encounter Vitals Group     BP 08/15/24 1638 (!) 144/82     Girls Systolic BP Percentile --      Girls Diastolic BP Percentile --      Boys Systolic BP Percentile --      Boys Diastolic BP Percentile --      Pulse Rate 08/15/24 1638 82     Resp 08/15/24 1638 18     Temp 08/15/24 1638 98.4 F (36.9 C)     Temp Source 08/15/24 1638 Oral     SpO2 08/15/24 1638 96 %     Weight --      Height --      Head Circumference --      Peak Flow --      Pain Score 08/15/24 1641 0     Pain Loc --      Pain Education --      Exclude from Growth Chart --    No data  found.  Updated Vital Signs BP (!) 144/82 (BP Location: Right Arm)   Pulse 82   Temp 98.4 F (36.9 C) (Oral)   Resp 18   SpO2 96%   Visual Acuity Right Eye Distance:   Left Eye Distance:   Bilateral Distance:    Right Eye Near:   Left Eye Near:    Bilateral Near:     Physical Exam Vitals and nursing note reviewed.  Constitutional:      General: He is not in acute distress.    Appearance: Normal appearance.  HENT:     Head: Normocephalic.     Right Ear: Tympanic membrane, ear canal and external ear normal.     Left Ear: Tympanic membrane, ear canal and external ear normal.     Mouth/Throat:     Mouth: Mucous membranes are moist.  Eyes:     Extraocular Movements: Extraocular movements intact.     Conjunctiva/sclera: Conjunctivae normal.     Pupils: Pupils are equal, round, and reactive to light.  Cardiovascular:     Rate and Rhythm: Normal rate and regular rhythm.     Pulses: Normal pulses.     Heart sounds: Normal heart sounds.  Pulmonary:     Effort: Pulmonary effort is normal. No respiratory distress.     Breath sounds: Normal breath sounds. No stridor. No wheezing, rhonchi or rales.  Musculoskeletal:     Cervical back: Normal range of motion.  Skin:    General: Skin is warm and dry.  Neurological:     General: No focal deficit present.     Mental Status: He is alert and oriented to person, place, and time.  Psychiatric:        Mood and Affect: Mood normal.        Behavior: Behavior normal.      UC Treatments / Results  Labs (all labs ordered are listed, but only abnormal results are displayed) Labs Reviewed - No data to display  EKG   Radiology No results found.  Procedures Procedures (including critical care time)  Medications Ordered in UC Medications - No data to display  Initial Impression / Assessment and Plan / UC Course  I have reviewed the triage vital signs and the nursing notes.  Pertinent labs & imaging results that were  available during my care of the patient were reviewed by me and considered in my medical decision making (see chart for details).  On exam, the patient's lung  sounds are clear throughout, room air sats are at 96%.  He is well-appearing, is in no acute distress, and vital signs are stable.  Differential diagnoses include postviral cough versus acute bronchitis.  Will treat with prednisone 40 mg for the next 5 days.  Symptomatic treatment provided with Promethazine  DM for the cough, and an albuterol  inhaler as needed for wheezing or shortness of breath.  Discussed indications with the patient regarding follow-up.  Patient was in agreement with this plan of care and verbalizes understanding.  All questions were answered.  Patient stable for discharge.  Work note was provided.  Final Clinical Impressions(s) / UC Diagnoses   Final diagnoses:  Cough, unspecified type     Discharge Instructions      Take medication as prescribed. Increase fluids and allow for plenty of rest. You may take over-the-counter Tylenol  as needed for pain, fever, or general discomfort. Recommend the use of a humidifier in your bedroom at nighttime during sleep and sleeping elevated on pillows while symptoms persist. As discussed, your cough may linger from days to weeks.  If you are generally feeling well, but continued to have a persistent nagging cough, continue cough drops, increasing fluids, and over-the-counter cough medications.  Seek care if you develop new symptoms such as fever, chills, wheezing, or difficulty breathing. If symptoms fail to improve with this treatment, you may follow-up in this clinic or with your primary care physician for further evaluation. Follow-up as needed.     ED Prescriptions     Medication Sig Dispense Auth. Provider   predniSONE (DELTASONE) 20 MG tablet Take 2 tablets (40 mg total) by mouth daily with breakfast for 5 days. 10 tablet Leath-Warren, Etta PARAS, NP    promethazine -dextromethorphan (PROMETHAZINE -DM) 6.25-15 MG/5ML syrup Take 5 mLs by mouth 4 (four) times daily as needed. 118 mL Leath-Warren, Etta PARAS, NP   albuterol  (VENTOLIN  HFA) 108 (90 Base) MCG/ACT inhaler Inhale 2 puffs into the lungs every 6 (six) hours as needed. 8 g Leath-Warren, Etta PARAS, NP      PDMP not reviewed this encounter.   Gilmer Etta PARAS, NP 08/15/24 1710

## 2024-08-15 NOTE — Discharge Instructions (Signed)
 Take medication as prescribed. Increase fluids and allow for plenty of rest. You may take over-the-counter Tylenol  as needed for pain, fever, or general discomfort. Recommend the use of a humidifier in your bedroom at nighttime during sleep and sleeping elevated on pillows while symptoms persist. As discussed, your cough may linger from days to weeks.  If you are generally feeling well, but continued to have a persistent nagging cough, continue cough drops, increasing fluids, and over-the-counter cough medications.  Seek care if you develop new symptoms such as fever, chills, wheezing, or difficulty breathing. If symptoms fail to improve with this treatment, you may follow-up in this clinic or with your primary care physician for further evaluation. Follow-up as needed.

## 2024-08-15 NOTE — ED Triage Notes (Signed)
 Pt reports cough, congestion x 1 week pt states last night he coughed so hard he became dizzy and had nausea and vomiting.
